# Patient Record
Sex: Male | Born: 1940 | Race: White | Hispanic: No | Marital: Married | Smoking: Never smoker
Health system: Southern US, Community
[De-identification: ages and names within clinical notes are randomized; demographics above are authoritative.]

## PROBLEM LIST (undated history)

## (undated) DIAGNOSIS — I2510 Atherosclerotic heart disease of native coronary artery without angina pectoris: Secondary | ICD-10-CM

## (undated) DIAGNOSIS — E785 Hyperlipidemia, unspecified: Secondary | ICD-10-CM

## (undated) DIAGNOSIS — N189 Chronic kidney disease, unspecified: Secondary | ICD-10-CM

## (undated) DIAGNOSIS — E119 Type 2 diabetes mellitus without complications: Secondary | ICD-10-CM

## (undated) DIAGNOSIS — R42 Dizziness and giddiness: Secondary | ICD-10-CM

## (undated) DIAGNOSIS — N529 Male erectile dysfunction, unspecified: Secondary | ICD-10-CM

## (undated) HISTORY — PX: EYE SURGERY: SHX253

## (undated) HISTORY — PX: COLONOSCOPY: SHX174

## (undated) HISTORY — PX: CORONARY ARTERY BYPASS GRAFT: SHX141

---

## 2004-08-24 ENCOUNTER — Ambulatory Visit: Payer: Self-pay | Admitting: Podiatry

## 2004-11-23 ENCOUNTER — Inpatient Hospital Stay: Payer: Self-pay | Admitting: Cardiology

## 2004-11-23 ENCOUNTER — Other Ambulatory Visit: Payer: Self-pay

## 2006-11-27 ENCOUNTER — Ambulatory Visit: Payer: Self-pay | Admitting: *Deleted

## 2006-12-01 ENCOUNTER — Emergency Department: Payer: Self-pay | Admitting: Emergency Medicine

## 2006-12-01 ENCOUNTER — Other Ambulatory Visit: Payer: Self-pay

## 2006-12-04 ENCOUNTER — Ambulatory Visit: Payer: Self-pay | Admitting: Surgery

## 2006-12-21 ENCOUNTER — Ambulatory Visit: Payer: Self-pay | Admitting: Surgery

## 2006-12-21 ENCOUNTER — Inpatient Hospital Stay (HOSPITAL_COMMUNITY): Admission: RE | Admit: 2006-12-21 | Discharge: 2006-12-25 | Payer: Self-pay | Admitting: Surgery

## 2007-01-15 ENCOUNTER — Ambulatory Visit: Payer: Self-pay | Admitting: Surgery

## 2007-01-15 ENCOUNTER — Encounter: Admission: RE | Admit: 2007-01-15 | Discharge: 2007-01-15 | Payer: Self-pay | Admitting: Surgery

## 2010-03-29 ENCOUNTER — Ambulatory Visit: Payer: Self-pay | Admitting: Gastroenterology

## 2010-03-30 LAB — PATHOLOGY REPORT

## 2010-07-05 NOTE — Op Note (Signed)
NAME:  Colton Gill, Colton Gill NO.:  [id number]   MEDICAL RECORD NO.:  [id number]          PATIENT TYPE:  INP   LOCATION:  2315                         FACILITY:  [facility]   PHYSICIAN:  Evelene Croon, M.D.     DATE OF BIRTH:  12-14-1940   DATE OF PROCEDURE:  12/21/2006  DATE OF DISCHARGE:                               OPERATIVE REPORT   PREOPERATIVE DIAGNOSIS:  Left main and severe three-vessel coronary  artery disease.   POSTOPERATIVE DIAGNOSIS:  Left main and severe three-vessel coronary  artery disease.   OPERATIVE PROCEDURE:  Median sternotomy, extracorporeal circulation,  coronary artery bypass graft surgery x3 using a left internal mammary  artery graft to the left anterior descending coronary artery, with a  saphenous vein graft to the obtuse marginal branch of the left  circumflex coronary artery, and a saphenous vein graft to the posterior  descending branch of the right coronary artery.  Endoscopic vein  harvesting from the right leg.   ATTENDING SURGEON:  Evelene Croon, M.D.   ASSISTANTS:  1. Ferne Reus, SAC.  2. Evon Slack, RNFA.   ANESTHESIA:  General endotracheal.   CLINICAL HISTORY:  This patient is a 70 year old gentleman with a  history of diabetes and coronary artery disease who is status post  stenting of his left circumflex in May 2001.  He underwent repeat  catheterization in 2002 showing 50% narrowing within the stent as well  as two other lesions in the mid and distal right coronary artery of  about 70%.  He was treated medically and underwent repeat  catheterization in October 2006.  At this time, he had stenting of the  proximal left circumflex coronary artery that had 90% stenosis.  He has  done well since then but over the past year has developed exertional  shortness of breath and fatigue.  He underwent a stress test that showed  basal inferior and apical inferior ischemia.  Cardiac catheterization on  November 28, 2006 by Dr. Lenise Herald showed a 30% distal left main  stenosis that appeared to have some ulcerated plaque present.  There is  about 70% ostial LAD stenosis that was hazy.  This lesion was also  calcified.  There is about 40% proximal LAD stenosis.  The left  circumflex had about 30% stenosis at the proximal margin of the  previously placed stent.  There is about 40% distal left circumflex  stenosis and a single large marginal branch.  The proximal right  coronary artery had about 30% stenosis and the distal right coronary  artery had about 70% stenosis.  There is a small to moderate sized  posterior descending branch and then a few smaller posterolateral  branches.  Left ventricular ejection fraction is about 45% with no  aortic stenosis or insufficiency.  There was no mitral regurgitation.  After review of the angiogram and examination of the patient, it was  felt that coronary artery bypass graft surgery was the best treatment to  prevent further ischemia and infarction.  I discussed the operative  procedure with the patient and his wife including the alternatives,  benefits and risks including but not limited to bleeding, blood  transfusion, infection, stroke, myocardial infarction, graft failure and  death.  They understood and agreed to proceed.   OPERATIVE PROCEDURE:  The patient was taken to the operating room and  placed on the table in the supine position.  After induction of general  endotracheal anesthesia, a Foley catheter was placed in the bladder  using sterile technique.  Then the chest, abdomen and both lower  extremities were prepped and draped in the usual sterile manner.  The  chest was entered through a median sternotomy incision and the  pericardium opened midline.  Examination of the heart showed good  ventricular contractility.  The ascending aorta had no palpable plaques  in it.   Then the left internal mammary artery was harvested from the chest wall  as a pedicle graft.   This was a medium caliber vessel with excellent  blood flow through it.  At the same time, segment of greater saphenous  vein was harvested from the right leg using endoscopic vein harvest  technique.  This vein was of medium size and good quality.   Then the patient was heparinized and when an adequate activated  clotting time was achieved, the distal ascending aorta was cannulated  using a 20-French aortic cannula for arterial inflow.  Venous outflow  was achieved using the two-stage venous cannula for the right atrial  appendage and antegrade cardioplegia and vent cannula was inserted into  the aortic root.   The patient was placed on cardiopulmonary bypass and the distal coronary  artery was identified.  The LAD was a large vessel in its proximal and  mid portions, tapering near the apex. There was heavy proximal disease  in it but the mid portion of the vessel was soft and suitable for  grafting.  There were several small diagonal branches.  The left  circumflex had a single large marginal branch that had no significant  disease in it.  The right coronary artery was diffusely diseased with a  calcific plaque throughout the proximal, mid and distal portions.  The  posterior descending branch was a small to medium size vessel.  It was  suitable for grafting and had no disease in it.  The posterolateral  branches were small non-grafting vessels.  The calcified plaque in the  main body of the right coronary artery extended out distally far enough  that I could not grasp beyond it.   Then, the aorta was cross clamped and 1,000 mL of cold blood antegrade  cardioplegia was administered in the aortic root with quick arrest of  the heart.  Systemic hypothermia to 28.0 degree centigrade and topical  hypothermia with iced saline was used.  A temperature probe was placed  in the septum, insulated and padded in the pericardium.   The first distal anastomosis was performed to the obtuse  marginal  branch.  The internal diameter was about 2 mm.  The conduit used was a 7  to greater saphenous vein.  The anastomosis performed in an end-to-side  manner using continuous 7-0 Prolene suture.  Flow administered to the  graft was excellent.   Second distal anastomosis was performed to the posterior descending  coronary artery.  The internal diameter was 1.6 mm.  The conduit used  was a second 7 to greater saphenous vein and the anastomosis performed  in the end-to-side manner using continuous 7-0 Prolene suture.  Flow  administered to the graft was excellent.  The third distal anastomosis was performed to the mid portion of the  left anterior descending coronary artery.  The internal diameter of this  vessel was about 2 mm.  The conduit used was a left internal mammary  graft and was brought through an opening in the left pericardium  anterior to the phrenic nerve.  It was anastomosed to the LAD in an end-  to-side manner using continuous 8-0 Prolene suture.  The pedicle was  sutured to the epicardium with 6-0 Prolene sutures.  The patient was  rewarmed to 37.0 degrees centigrade.  With the cross-clamp in place, the  two proximal vein graft anastomoses were performed at the aortic root in  an end-to-side manner using continuous 6-0 Prolene suture.  Then the  clamp was removed from the mammary pedicle.  There was rapid warming of  the ventricular septum and returned spontaneous ventricular  fibrillation.  Cross-clamp was removed at the time of 53 minutes and the  patient spontaneously converted to sinus rhythm.  The proximal and  distal anastomosis appeared hemostatic along the grafts fascia.  Graft  markers were placed around the proximal anastomoses.  Two temporary  right ventricular and right atrial pacing wires were placed and brought  through the skin.   When the patient had rewarmed to 37.0 degrees centigrade, he was weaned  from cardiopulmonary bypass on no inotropic  agents.  Total bypass time  was 68 minutes.  Cardiac function appeared excellent with a cardiac  output of 10 liters per minute.  Protamine was given and the venous and  aortic cannulas were removed without difficulty.  Hemostasis was  achieved.  Three chest tubes were placed with a tube in the posterior  pericardium, one in the left pleural space and one in the anterior  mediastinum.  The pericardium was loosely reapproximated over the heart.  The sternum was closed with #6 stainless steel wires.  The fascia was  closed with continuous #1 Vicryl suture.  Subcutaneous tissue was closed  with continuous 2-0 Vicryl and the skin with 3-0 Vicryl subcuticular  closure.  The lower extremity vein harvest site was closed in layers in  a similar manner.  The sponge, needle, and instrument counts were  correct according to the scrub nurse.  Dry sterile dressings were  applied over the incisions, around the chest tubes left to Pleur-evac  suction. The patient remained hemodynamically stable and was transported  to the ICU in guarded but stable condition.      Evelene Croon, M.D.  Electronically Signed     BB/MEDQ  D:  12/21/2006  T:  12/21/2006  Job:  [id number]   cc:   Evelene Croon, M.D.  Darlin Priestly, MD  Cardiac Catheterization Lab [facility]

## 2010-07-05 NOTE — Assessment & Plan Note (Signed)
OFFICE VISIT   Colton Gill, Colton Gill  DOB:  02-28-1940                                        January 15, 2007  CHART #:  [id number]   The patient returns today for followup status post coronary artery  bypass graft surgery x3 on 12/21/2006.  He said he has been feeling well  overall and is walking short distances [place] without any  difficulty.  He is planning on starting cardiac rehab in the near  future.   PHYSICAL EXAMINATION:  VITAL SIGNS:  Blood pressure is 139/74.  His  pulse is 72 and regular.  Respiratory rate is 18, unlabored.  Oxygen  saturation on room air is 98%.  He looks well.  CARDIAC:  Exam shows a  regular rate and rhythm with normal heart sounds.  LUNGS:  Exam is  clear.  CHEST:  Incision is healing well and the sternum is stable.  EXTREMITIES:  His leg incision is healing well and there is no lower  extremity edema.   DATA:  Followup chest x-ray shows improved bibasilar aeration.  There is  no pneumothorax or effusion.   MEDICATIONS:  1. Aspirin 325 mg daily.  2. Metformin 1000 mg b.i.d.  3. Zocor 40 mg daily.  4. Toprol XL 50 mg daily.  5. Amaryl 4 mg daily.  6. Glyburide 5 mg b.i.d.  7. Ramipril 1.25 mg daily.  8. Plavix 75 mg daily.  9. Fish oil 1200 mg b.i.d.  10.Vitamin daily.  11.Oxycodone p.r.n. for pain, which he has stopped taking.   IMPRESSION:  Overall the patient is recovering well following his  surgery.  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  I encouraged him to continue walking as much as  possible and to participate in cardiac rehab.  He will continue to  follow up with Dr. Lenise Herald and will contact me if he develops any  problems with his incision.   Evelene Croon, M.D.  Electronically Signed   BB/MEDQ  D:  01/15/2007  T:  01/15/2007  Job:  [id number]   cc:   Darlin Priestly,  MD  Dr. Sherlie Ban, M.D.

## 2010-07-05 NOTE — Discharge Summary (Signed)
NAME:  Colton Gill, Colton Gill NO.:  [id number]   MEDICAL RECORD NO.:  [id number]          PATIENT TYPE:  INP   LOCATION:  2016                         FACILITY:  [facility]   PHYSICIAN:  Rowe Clack, P.A.-C. DATE OF BIRTH:  Jun 14, 1940   DATE OF ADMISSION:  12/21/2006  DATE OF DISCHARGE:  12/25/2006                               DISCHARGE SUMMARY   HISTORY OF PRESENT ILLNESS:  The patient is a 70 year old gentleman  referred to Dr. Evelene Croon in surgical consultation for severe 3-  vessel coronary artery disease.  The patient has a history of coronary  artery disease and has had a previous stenting of the left circumflex in  may of 2001.  A repeat catheterization in 2002 showed a 50% narrowing of  the stent as well as 2 lesions in the mid and distal right coronary  arteries of 70%.  He was treated medical and underwent repeat  catheterization on November 23, 2004, and this showed a 30% osteal left  main stenosis with a 50% osteal stenosis in the LAD.  The mid LAD had  mild luminal irregularities.  The proximal circumflex had a 90% stenosis  which was stented successfully with a Cypher stent.  The mid left  circumflex had diffuse 50% stenosis.  The proximal right coronary artery  had no significant change.  Since 2006, the patient did well but did  report exertional shortness of breath over the past year or so.  He  would usually notice this with ambulation up inclines.  He reported  having some mild localized left chest pain which resolved without any  treatment.  This would usually occur with exertion.  A stress test  revealed ischemia in the basal inferior to apical inferior walls.  He  therefore underwent repeat cardiac catheterization by Dr. Jenne Campus on  November 28, 2006.  This showed the distal left main had about a 30%  stenosis.  The osteal LAD had a 70% stenosis.  The lesion was calcified.  There was a 40% proximal LAD stenosis.  The left circumflex had a 30%  stenosis  at the proximal margin of the previously stented segment.  There was about a 40% distal left circumflex stenosis.  The proximal  right coronary artery had a 30% stenosis and the distal right coronary  artery had a 70% stenosis.  Upon evaluation by Dr. Laneta Simmers, it had  appeared that a left main stenosis was very irregular and somewhat  ulcerated in appearance.  The ventricular ejection fraction was about  45% with no specific regional wall motion abnormalities.  There was no  aortic stenosis or insufficiency.  There was no mitral regurgitation.  It was Dr. Scheryl Darter opinion that the patient should proceed with  surgical revascularization and he was admitted this hospitalization for  the procedure.   ALLERGIES:  NONE.   MEDICATIONS PRIOR TO ADMISSION:  1. Aspirin 81 mg daily.  2. Fish oil 1200 mg b.i.d.  3. Metformin 1000 mg b.i.d.  4. Zocor 20 mg daily.  5. Imdur 30 mg daily.  6. Toprol XL 50 mg daily.  7.  Amaryl 4 mg b.i.d.  8. Ramipril 10 mg daily.  9. Glyburide 5 mg q.i.d.  10.Plavix 75 mg, which was recently stopped in anticipation of      surgery.   PAST MEDICAL HISTORY:  1. Diabetes.  2. Hypertension.  3. Hyperlipidemia.  4. Coronary artery disease as described.  5. History of erectile dysfunction.   Family history, social history, review of systems, and physical exam,  please see the history and physical done at time of admission.   HOSPITAL COURSE:  The patient was admitted electively and on December 21, 2006 taken to the operating room where he underwent a coronary artery  bypass grafting x3 using a left internal mammary artery to the left  anterior descending coronary artery.  Additionally, he had a saphenous  vein graft to the obtuse marginal branch of the left circumflex coronary  artery.  Finally, a saphenous vein graft was placed to the posterior  descending branch of the right coronary artery.  The patient tolerated  the procedure well and was taken to the  surgical intensive care unit in  stable condition.   POSTOPERATIVE HOSPITAL COURSE:  The patient has overall done quite well.  He is maintaining stable hemodynamics.  He has been neurologically  intact.  He was weaned from the ventilator without difficulty using  standard protocols.  All routine lines, monitors, and drainage devices  have been discontinued in the standard fashion.  His diabetes is well-  controlled initially with the glucomander protocol.  He has subsequently  been converted back to his oral medical regimen for his diabetes.  His  laboratory values do reveal a mild postoperative anemia.  His most  recent hemoglobin and hematocrit dated December 24, 2006 are 11 and 33  respectively.  Electrolytes, BUN, and creatinine are within normal  limits.  His oxygen is being weaned and he maintains adequate  saturations.  He is responding well to a general diuresis.  He does have  some mild edema and will require a little more diuresis as an  outpatient.  His incisions are healing well without signs of infection.  He is tolerating cardiac rehabilitation using standard protocols.  His  overall progress is felt to be quite good, and tentatively he is felt to  be stable for discharge in the morning of December 25, 2006 pending  morning round reevaluation.   MEDICATIONS AT TIME OF DISCHARGE:  1. Aspirin 325 mg daily.  2. Centrum Silver vitamin 1 daily.  3. Fish oil 1200 mg b.i.d.  4. Metformin 1000 mg b.i.d.  5. Zocor 40 mg daily.  6. Toprol XL 50 mg daily.  7. Amaryl 4 mg daily.  8. Ramipril 1.25 mg daily.  9. Glyburide 5 mg twice daily.  10.Plavix 75 mg daily.  11.Lasix 40 mg daily for 5 days.  12.K-Dur 20 mEq daily for 5 days.  13.For pain, oxycodone 5 mg one to two every 4 to 6 hours as needed.   INSTRUCTIONS:  The patient will receive written instructions regarding  medications, activity, diet, wound care, and follow up.   Follow up will include Dr. Jenne Campus in 2 weeks, Dr.  Laneta Simmers in 3 weeks.   FINAL DIAGNOSES:  1. Severe coronary artery disease as described above, now status post      surgical revascularization as described above.  2. Mild postoperative anemia.  3. Diabetes mellitus.  4. Hypertension.  5. Hyperlipidemia.  6. History of erectile dysfunction.      Rowe Clack,  P.A.-C.     Sherryll Burger  D:  12/24/2006  T:  12/24/2006  Job:  [id number]   cc:   Evelene Croon, M.D.  Darlin Priestly, MD  Worthy Keeler, Dr.

## 2010-07-05 NOTE — Consult Note (Signed)
NEW PATIENT CONSULTATION   JUSITN, SALSGIVER  DOB:  07/24/1940                                        December 04, 2006  CHART #:  [id number]   REASON FOR CONSULTATION:  Severe 3-vessel coronary artery disease.   CLINICAL HISTORY:  I was asked by Dr. Lenise Herald to evaluate Mr.  Colton Gill for consideration of coronary artery bypass graft surgery.  He is  a 70 year old gentleman with a history of coronary disease.  He is a 89-  year-old gentleman with a history of coronary disease status post  stenting of the left circumflex in May of 2001.  He had repeat  catheterization in 2002 showing 50% narrowing of the stent, as well as 2  lesions in the mid and distal right coronary artery of 70%.  He was  treated medically and underwent repeat catheterization on 11/23/2004  showing 30% ostial left main disease and 50% ostial stenosis in the LAD.  The mid LAD had mild luminal irregularities.  The proximal circumflex  had 90% stenosis which was stented successfully with a Cypher stent.  The mid left circumflex had diffuse 50% stenosis.  The proximal right  coronary artery had no significant change.  Since 2006, he has done well  but he does report exertional shortness of breath over the past year or  so.  He usually notices when he walks up inclines.  He reports having  some mild localized left chest pain which resolved without any  treatment.  This usually also occurs with exertion.  He underwent a  stress test which showed ischemia in the basal inferior to apical  inferior walls.  He subsequently underwent cardiac catheterization by  Dr. Jenne Campus on 11/28/2006.  This showed the distal left main had about  30% stenosis.  The ostial LAD had 70% stenosis.  This lesion was  calcified.  There is about 40% proximal LAD stenosis.  The left  circumflex had 30% stenosis at the proximal margin of the previously  stented segment.  There is about 40% distal left circumflex stenosis.  The  proximal right coronary artery had 30% stenosis and the distal right  coronary artery had 70% stenosis.  On my review of the catheterization,  it appeared that this left main stenosis was very irregular and somewhat  ulcerated in appearance.  Left ventricular ejection fraction was about  45% with no specific regional wall motion abnormalities.  There was no  evidence of aortic stenosis or insufficiency.  There was no mitral  regurgitation.   REVIEW OF SYSTEMS:  GENERAL:  He denies any fever or chills.  He has had  no recent weight changes.  He denies fatigue although his wife said he  will fall asleep whenever he stays still very long.  EYES:  Negative.  ENT:  Negative.  ENDOCRINE:  He denies hypothyroidism.  He has had type 2 diabetes since  age 20.  CARDIOVASCULAR:  He has focal left-sided chest pain with exertion that  resolves quickly with rest.  He denies any pressure.  He has exertional  dyspnea on walking up inclines but not on flat land.  He denies PND and  orthopnea.  He has had no peripheral edema.  He denies arrhythmias.  RESPIRATORY:  He denies cough and sputum production.  GI:  He has had no nausea or vomiting.  Denies melena and bright red  blood per rectum.  GU:  He denies dysuria and hematuria.  VASCULAR:  He denies claudication and phlebitis.  NEUROLOGICAL:  He denies any focal weakness or numbness.  Denies  dizziness and syncope.  He has never had a TIA or a stroke.  MUSCULOSKELETAL:  He denies arthralgias and myalgias.  HEMATOLOGICAL:  He denies any bleeding disorders or easy bleeding.   ALLERGIES:  None.   MEDICATIONS:  Aspirin 81 mg daily, fish oil 1200 mg b.i.d., metformin  1000 mg b.i.d., Zocor 20 mg daily, Imdur 30 mg daily, Toprol XL 50 mg  daily, Amaril 4 mg b.i.d., ramipril 10 mg daily, glyburide 5 mg q.i.d.,  and Plavix 75 mg daily which stopped ago with anticipation of surgery.   PAST MEDICAL HISTORY:  Significant for diabetes as mentioned above.  He   has a history of hypertension and hyperlipidemia.  He has a history of  coronary disease status post percutaneous intervention as mentioned  above.  He has a history of erectile dysfunction.   SOCIAL HISTORY:  He is married and has 1 son.  He is here with his wife  today.  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  He does not smoke.  He drinks occasional alcohol.   FAMILY HISTORY:  His father had diabetes and kidney disease.  His mother  was healthy into her 90s.   PHYSICAL EXAMINATION:  His blood pressure is 147/63.  Pulse 63 and  regular.  Respiratory rate is 18, nonlabored.  Oxygen saturation on room  air is 97%.  He is a well-developed white male in no distress.  HEENT  Exam:  Shows him to be normocephalic and atraumatic.  Pupils are equal  and reactive to light and accommodation.  His extraocular muscles are  intact.  His throat is clear.  Neck Exam:  Shows normal carotid pulses  bilaterally.  There are no bruits.  There is no adenopathy or  thyromegaly.  Cardiac Exam:  Shows a regular rate and rhythm with normal  S1 and S2.  There is no murmur, rub, or gallop.  His lungs are clear.  Abdominal Exam:  Shows active bowel sounds.  His abdomen is soft and  nontender.  There are no palpable masses or organomegaly.  Extremity  Exam:  Shows no peripheral edema.  Pedal pulses are palpable  bilaterally.  His skin is warm and dry.  Neurologic Exam:  Shows him to  be alert and oriented x3.  Motor and sensory exam is grossly normal.   IMPRESSION:  Colton Gill has an ulcerated moderate distal left main  stenosis and 3-vessel coronary disease with 70% ostial LAD stenosis.  He  has ischemia by stress test and symptoms of ischemia that are not  classical angina.  I agree that coronary artery bypass graft surgery is  the best treatment to prevent further ischemia and infarction.  I  discussed the operative procedure with the patient and his wife  including  alternatives, benefits, and risks including but not limited  bleeding, blood transfusion, infection, stroke, myocardial infarction,  graft failure, and death.  He understands and agrees to proceed.  We  will plan to do surgery in the next 2 weeks.   Evelene Croon, M.D.  Electronically Signed   BB/MEDQ  D:  12/04/2006  T:  12/05/2006  Job:  [id number]   cc:   Darlin Priestly, MD  Doristine Church  Graciela Husbands, MD

## 2010-11-29 LAB — CBC
HCT: 31.4 — ABNORMAL LOW
HCT: 32.3 — ABNORMAL LOW
HCT: 33.1 — ABNORMAL LOW
Hemoglobin: 10.6 — ABNORMAL LOW
Hemoglobin: 10.9 — ABNORMAL LOW
Hemoglobin: 11 — ABNORMAL LOW
MCHC: 33.3
MCHC: 33.8
MCHC: 33.8
MCV: 84.6
MCV: 85.3
MCV: 85.4
MCV: 86.7
Platelets: 146 — ABNORMAL LOW
Platelets: 181
RBC: 3.69 — ABNORMAL LOW
RBC: 3.74 — ABNORMAL LOW
RBC: 3.78 — ABNORMAL LOW
RBC: 3.82 — ABNORMAL LOW
RDW: 13.5
RDW: 13.7
RDW: 14
WBC: 10
WBC: 7.2
WBC: 8.9

## 2010-11-29 LAB — BASIC METABOLIC PANEL
BUN: 12
BUN: 13
BUN: 15
BUN: 8
CO2: 28
CO2: 28
CO2: 28
Calcium: 7.4 — ABNORMAL LOW
Calcium: 7.7 — ABNORMAL LOW
Calcium: 7.8 — ABNORMAL LOW
Calcium: 8.2 — ABNORMAL LOW
Chloride: 104
Chloride: 105
Chloride: 107
Creatinine, Ser: 0.96
Creatinine, Ser: 1.02
Creatinine, Ser: 1.09
GFR calc Af Amer: >60
GFR calc Af Amer: >60
GFR calc Af Amer: >60
GFR calc Af Amer: >60
GFR calc non Af Amer: >60
GFR calc non Af Amer: >60
GFR calc non Af Amer: >60
Glucose, Bld: 108 — ABNORMAL HIGH
Glucose, Bld: 172 — ABNORMAL HIGH
Glucose, Bld: 193 — ABNORMAL HIGH
Glucose, Bld: 211 — ABNORMAL HIGH
Potassium: 3.8
Potassium: 4.3
Potassium: 4.3
Sodium: 136
Sodium: 136
Sodium: 138
Sodium: 139

## 2010-11-29 LAB — MAGNESIUM: Magnesium: 2.4

## 2010-11-29 LAB — CREATININE, SERUM: GFR calc non Af Amer: >60

## 2010-11-30 LAB — I-STAT EC8
BUN: 7
Bicarbonate: 22.2
Chloride: 106
Glucose, Bld: 126 — ABNORMAL HIGH
HCT: 32 — ABNORMAL LOW
Hemoglobin: 10.9 — ABNORMAL LOW
Sodium: 142

## 2010-11-30 LAB — POCT I-STAT 3, ART BLOOD GAS (G3+)
Acid-base deficit: 4 — ABNORMAL HIGH
Acid-base deficit: 4 — ABNORMAL HIGH
Bicarbonate: 23.2
O2 Saturation: 100
O2 Saturation: 93
O2 Saturation: 95
Patient temperature: 37
TCO2: 22
TCO2: 23
TCO2: 24
pCO2 arterial: 36.6
pCO2 arterial: 42.3
pO2, Arterial: 268 — ABNORMAL HIGH

## 2010-11-30 LAB — URINALYSIS, ROUTINE W REFLEX MICROSCOPIC
Bilirubin Urine: NEGATIVE
Hgb urine dipstick: NEGATIVE

## 2010-11-30 LAB — BLOOD GAS, ARTERIAL
Acid-base deficit: 2.5 — ABNORMAL HIGH
O2 Saturation: 97.1
Patient temperature: 98.6
pCO2 arterial: 34.4 — ABNORMAL LOW
pO2, Arterial: 83.7

## 2010-11-30 LAB — URINE MICROSCOPIC-ADD ON

## 2010-11-30 LAB — PROTIME-INR
INR: 1.3
Prothrombin Time: 12.8

## 2010-11-30 LAB — POCT I-STAT 4, (NA,K, GLUC, HGB,HCT)
Glucose, Bld: 182 — ABNORMAL HIGH
Glucose, Bld: 198 — ABNORMAL HIGH
Glucose, Bld: 238 — ABNORMAL HIGH
Glucose, Bld: 270 — ABNORMAL HIGH
HCT: 21 — ABNORMAL LOW
HCT: 32 — ABNORMAL LOW
HCT: 34 — ABNORMAL LOW
Hemoglobin: 10.9 — ABNORMAL LOW
Hemoglobin: 11.9 — ABNORMAL LOW
Hemoglobin: 7.1 — CL
Operator id: 3406
Operator id: 3406
Potassium: 3.7
Potassium: 4.4
Potassium: 4.4
Sodium: 132 — ABNORMAL LOW
Sodium: 136
Sodium: 139

## 2010-11-30 LAB — TYPE AND SCREEN

## 2010-11-30 LAB — CBC
HCT: 41.6
Hemoglobin: 11.7 — ABNORMAL LOW
MCHC: 34
MCHC: 34.1
MCV: 84.3
Platelets: 155
RBC: 4.01 — ABNORMAL LOW
RBC: 4.93
RDW: 13.4
WBC: 12.6 — ABNORMAL HIGH
WBC: 5.8

## 2010-11-30 LAB — CREATININE, SERUM
Creatinine, Ser: 0.79
GFR calc Af Amer: >60
GFR calc non Af Amer: >60

## 2010-11-30 LAB — COMPREHENSIVE METABOLIC PANEL
ALT: 30
AST: 26
Albumin: 4
Chloride: 103
GFR calc non Af Amer: >60
Glucose, Bld: 262 — ABNORMAL HIGH

## 2010-11-30 LAB — MAGNESIUM: Magnesium: 2.6 — ABNORMAL HIGH

## 2010-11-30 LAB — APTT
aPTT: 32
aPTT: 33

## 2010-11-30 LAB — HEMOGLOBIN A1C: Hgb A1c MFr Bld: 8.9 — ABNORMAL HIGH

## 2011-10-02 ENCOUNTER — Inpatient Hospital Stay: Payer: Self-pay | Admitting: Internal Medicine

## 2011-10-02 DIAGNOSIS — I200 Unstable angina: Secondary | ICD-10-CM

## 2011-10-02 LAB — URINALYSIS, COMPLETE
Bacteria: NONE SEEN
Glucose,UR: >=500 mg/dL (ref 0–75)
Leukocyte Esterase: NEGATIVE
Nitrite: NEGATIVE
RBC,UR: NONE SEEN /HPF (ref 0–5)
Specific Gravity: 1.008 (ref 1.003–1.030)
WBC UR: <1 /HPF (ref 0–5)

## 2011-10-02 LAB — CBC WITH DIFFERENTIAL/PLATELET
Basophil %: 1.2 %
Eosinophil #: 0.1 10*3/uL (ref 0.0–0.7)
Eosinophil %: 2 %
HGB: 13.6 g/dL (ref 13.0–18.0)
MCV: 86 fL (ref 80–100)
Monocyte %: 10.4 %
Neutrophil %: 63.5 %
Platelet: 180 10*3/uL (ref 150–440)
RBC: 4.68 10*6/uL (ref 4.40–5.90)
WBC: 5.3 10*3/uL (ref 3.8–10.6)

## 2011-10-02 LAB — COMPREHENSIVE METABOLIC PANEL
BUN: 18 mg/dL (ref 7–18)
Bilirubin,Total: 0.5 mg/dL (ref 0.2–1.0)
Chloride: 105 mmol/L (ref 98–107)
Creatinine: 1.38 mg/dL — ABNORMAL HIGH (ref 0.60–1.30)
EGFR (African American): >60
Glucose: 193 mg/dL — ABNORMAL HIGH (ref 65–99)
SGPT (ALT): 35 U/L (ref 12–78)
Total Protein: 8 g/dL (ref 6.4–8.2)

## 2011-10-02 LAB — TROPONIN I: Troponin-I: <0.02 ng/mL

## 2011-10-02 LAB — PROTIME-INR: Prothrombin Time: 12.6 secs (ref 11.5–14.7)

## 2011-10-03 LAB — BASIC METABOLIC PANEL
Calcium, Total: 8.7 mg/dL (ref 8.5–10.1)
Creatinine: 1.32 mg/dL — ABNORMAL HIGH (ref 0.60–1.30)
EGFR (Non-African Amer.): 55 — ABNORMAL LOW
Potassium: 4 mmol/L (ref 3.5–5.1)
Sodium: 143 mmol/L (ref 136–145)

## 2011-10-03 LAB — CBC WITH DIFFERENTIAL/PLATELET
Basophil #: 0.1 10*3/uL (ref 0.0–0.1)
Basophil %: 1.5 %
Eosinophil #: 0.1 10*3/uL (ref 0.0–0.7)
HGB: 12.8 g/dL — ABNORMAL LOW (ref 13.0–18.0)
Lymphocyte %: 22.5 %
MCHC: 34.1 g/dL (ref 32.0–36.0)
Neutrophil %: 62.9 %
Platelet: 174 10*3/uL (ref 150–440)

## 2012-03-22 ENCOUNTER — Ambulatory Visit: Payer: Self-pay | Admitting: Ophthalmology

## 2012-08-20 LAB — TROPONIN I: Troponin-I: <0.02 ng/mL

## 2012-08-21 ENCOUNTER — Inpatient Hospital Stay: Payer: Self-pay | Admitting: Internal Medicine

## 2012-08-21 LAB — COMPREHENSIVE METABOLIC PANEL
Alkaline Phosphatase: 43 U/L — ABNORMAL LOW (ref 50–136)
Anion Gap: 4 — ABNORMAL LOW (ref 7–16)
BUN: 22 mg/dL — ABNORMAL HIGH (ref 7–18)
Bilirubin,Total: 0.5 mg/dL (ref 0.2–1.0)
Calcium, Total: 8.5 mg/dL (ref 8.5–10.1)
Chloride: 108 mmol/L — ABNORMAL HIGH (ref 98–107)
Co2: 29 mmol/L (ref 21–32)
Glucose: 101 mg/dL — ABNORMAL HIGH (ref 65–99)
Potassium: 4.4 mmol/L (ref 3.5–5.1)
Total Protein: 6.8 g/dL (ref 6.4–8.2)

## 2012-08-21 LAB — URINALYSIS, COMPLETE
Bacteria: NONE SEEN
Bilirubin,UR: NEGATIVE
Blood: NEGATIVE
Leukocyte Esterase: NEGATIVE
Nitrite: NEGATIVE
Ph: 5 (ref 4.5–8.0)
Specific Gravity: 1.005 (ref 1.003–1.030)
Squamous Epithelial: NONE SEEN

## 2012-08-21 LAB — CBC WITH DIFFERENTIAL/PLATELET
Basophil #: 0 10*3/uL (ref 0.0–0.1)
HCT: 37.1 % — ABNORMAL LOW (ref 40.0–52.0)
Lymphocyte #: 1 10*3/uL (ref 1.0–3.6)
MCHC: 34 g/dL (ref 32.0–36.0)
Neutrophil #: 5.5 10*3/uL (ref 1.4–6.5)
Platelet: 152 10*3/uL (ref 150–440)
RDW: 14.6 % — ABNORMAL HIGH (ref 11.5–14.5)

## 2012-08-22 LAB — BASIC METABOLIC PANEL
BUN: 18 mg/dL (ref 7–18)
Chloride: 108 mmol/L — ABNORMAL HIGH (ref 98–107)
Co2: 28 mmol/L (ref 21–32)
Creatinine: 1.74 mg/dL — ABNORMAL HIGH (ref 0.60–1.30)
EGFR (African American): 45 — ABNORMAL LOW
EGFR (Non-African Amer.): 39 — ABNORMAL LOW
Glucose: 154 mg/dL — ABNORMAL HIGH (ref 65–99)
Osmolality: 288 (ref 275–301)

## 2012-08-26 ENCOUNTER — Ambulatory Visit: Payer: Self-pay | Admitting: Urology

## 2012-09-05 ENCOUNTER — Ambulatory Visit: Payer: Self-pay | Admitting: Urology

## 2012-09-20 ENCOUNTER — Ambulatory Visit: Payer: Self-pay | Admitting: Urology

## 2013-05-01 ENCOUNTER — Ambulatory Visit: Payer: Self-pay | Admitting: Urology

## 2014-03-13 ENCOUNTER — Emergency Department: Payer: Self-pay | Admitting: Emergency Medicine

## 2014-03-13 LAB — BASIC METABOLIC PANEL
Anion Gap: 5 — ABNORMAL LOW (ref 7–16)
BUN: 25 mg/dL — ABNORMAL HIGH (ref 7–18)
CREATININE: 1.99 mg/dL — AB (ref 0.60–1.30)
Calcium, Total: 9.3 mg/dL (ref 8.5–10.1)
Chloride: 108 mmol/L — ABNORMAL HIGH (ref 98–107)
Co2: 29 mmol/L (ref 21–32)
EGFR (Non-African Amer.): 35 — ABNORMAL LOW
GFR CALC AF AMER: 43 — AB
GLUCOSE: 123 mg/dL — AB (ref 65–99)
Osmolality: 289 (ref 275–301)
Potassium: 4.5 mmol/L (ref 3.5–5.1)
SODIUM: 142 mmol/L (ref 136–145)

## 2014-03-13 LAB — CBC
HCT: 40.3 % (ref 40.0–52.0)
HGB: 13.1 g/dL (ref 13.0–18.0)
MCH: 28.8 pg (ref 26.0–34.0)
MCHC: 32.4 g/dL (ref 32.0–36.0)
MCV: 89 fL (ref 80–100)
Platelet: 171 10*3/uL (ref 150–440)
RBC: 4.53 10*6/uL (ref 4.40–5.90)
RDW: 14.3 % (ref 11.5–14.5)
WBC: 4.5 10*3/uL (ref 3.8–10.6)

## 2014-03-13 LAB — TROPONIN I: Troponin-I: <0.02 ng/mL

## 2014-03-28 IMAGING — CR DG ABDOMEN 1V
1 series · 1 of 1 positions shown · non-contrast
Comparison: none

REASON FOR EXAM: Calculus
COMMENTS:

[t abdomen supine]
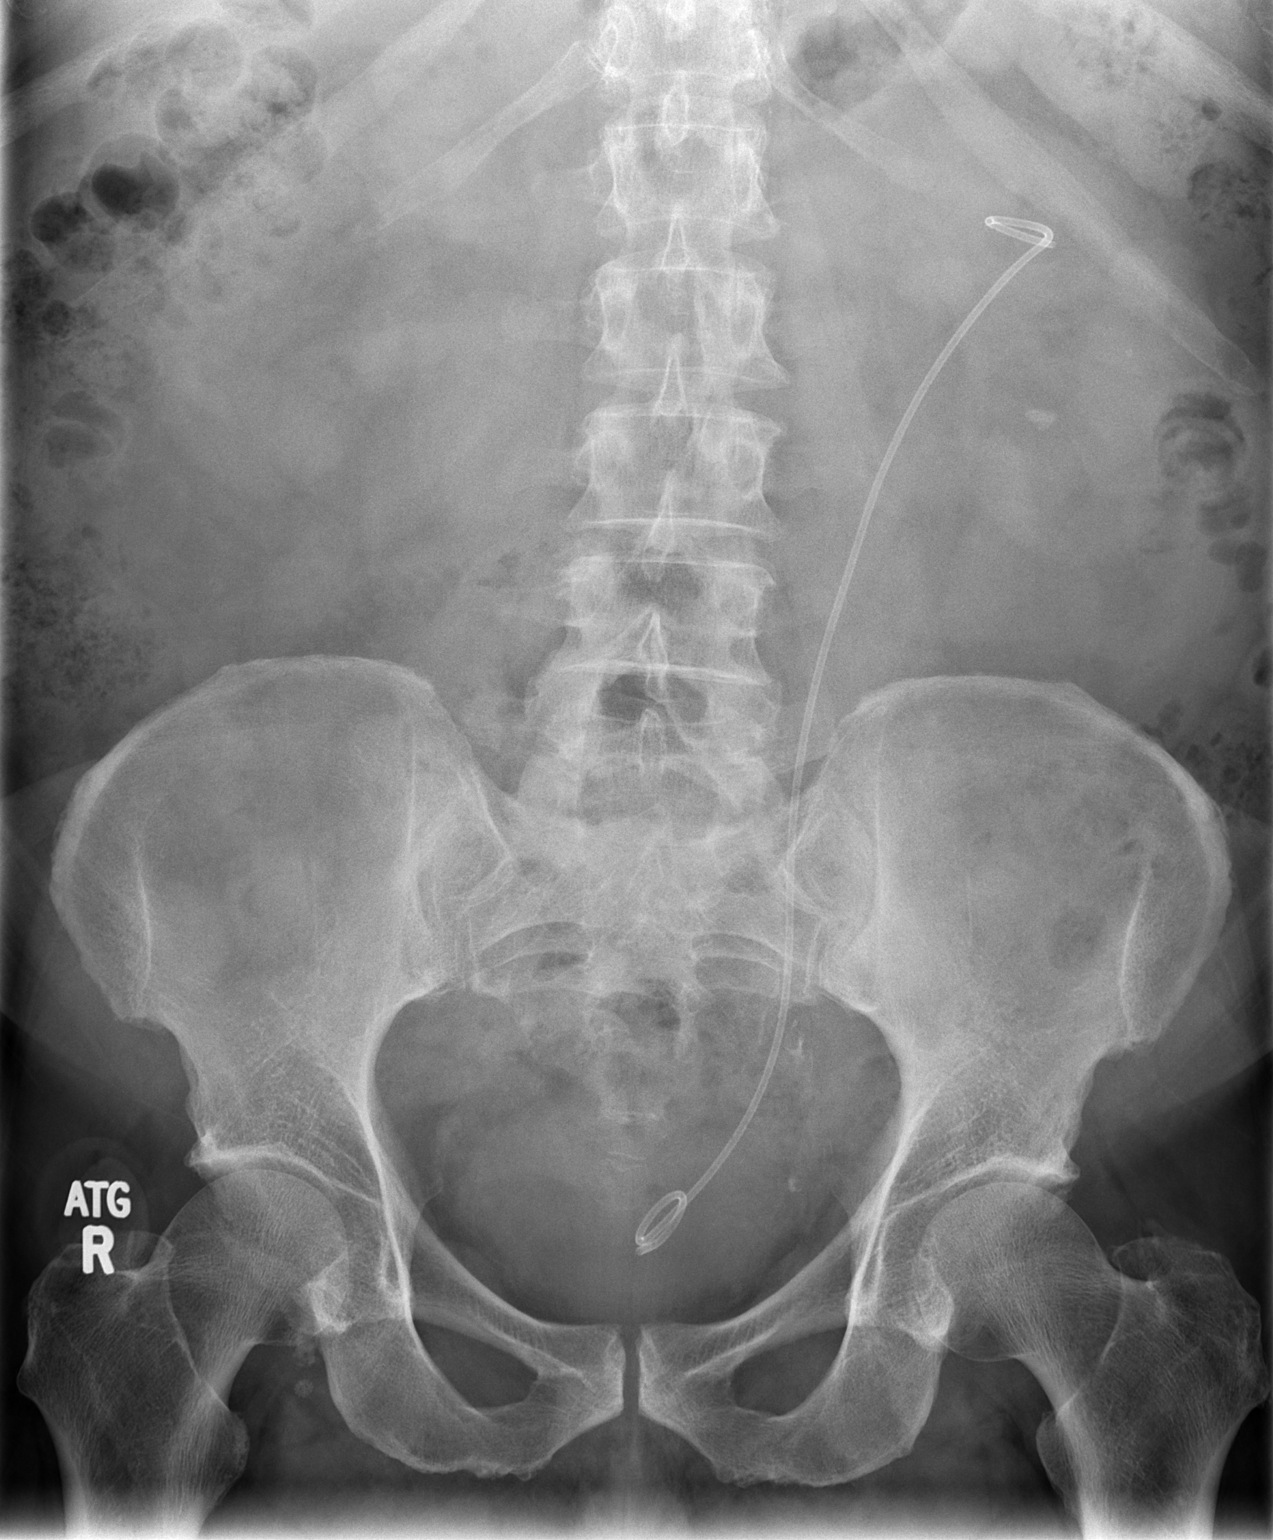

[1 of 1 positions shown; findings below may reference images not displayed]

PROCEDURE:     DXR - DXR KIDNEY URETER BLADDER  - September 05, 2012  [DATE]

RESULT:     Comparison is made to study 26 August, 2012.

A double-J ureteral stent remains in place on the left. A 4 x 8 mm calcified
stone projects over the lower pole of the left kidney. There may be a
smaller stone over the midpole. On the right no definite stones are
demonstrated.
IMPRESSION: There is a double-J ureteral stone correction ureteral
stent on the left. A 4 x 8 mm stone previously demonstrated in the proximal
left ureter now lies in a lower pole calyx on the left.

[REDACTED]

## 2014-04-12 IMAGING — CR DG ABDOMEN 1V
1 series · 1 of 1 positions shown · non-contrast
Comparison: none

REASON FOR EXAM: nephrolithasis, renal colic
COMMENTS:

[ap]
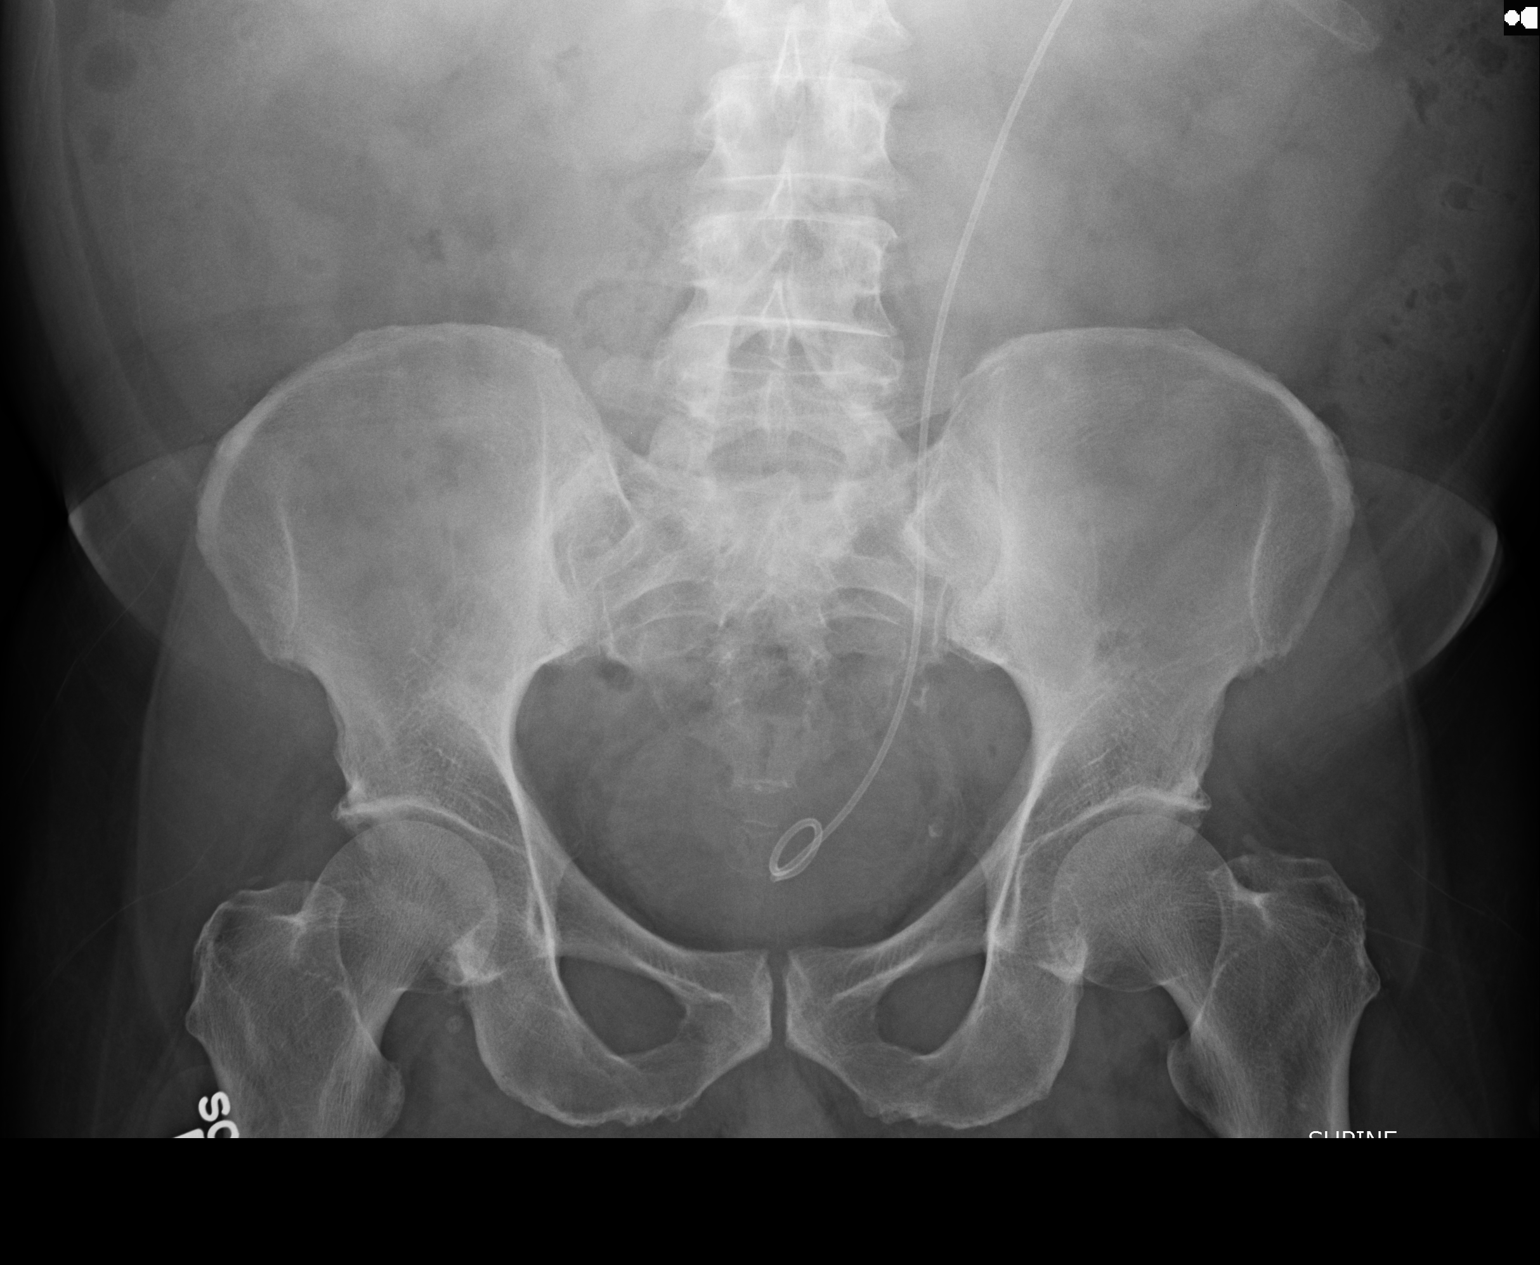

[1 of 1 positions shown; findings below may reference images not displayed]

PROCEDURE:     DXR - DXR KIDNEY URETER BLADDER  - September 20, 2012  [DATE]

RESULT:     Comparison is made to the study of 09/05/2012.

Left-sided double-J ureteral stent is present. No definite stones are seen
adjacent to the proximal portion of the stent. There some minimal density
just inferior to the left sacroiliac joint which could represent vascular
calcification or stone density. Correlate clinically. This is unchanged. The
bowel gas pattern is unremarkable.
IMPRESSION: Please see above.

[REDACTED]

## 2014-06-09 NOTE — Discharge Summary (Signed)
PATIENT NAME:  Colton Gill, Colton Gill MR#:  [id number] DATE OF BIRTH:  1940-07-16  DATE OF ADMISSION:  10/02/2011 DATE OF DISCHARGE:  10/03/2011  FINAL DIAGNOSES:  1. Gastrointestinal bleeding, possibly diverticular versus rectal outlet.  2. Coronary artery disease with prior bypass surgery.  3. Chronic left-sided systolic congestive heart failure.  4. Adult onset diabetes mellitus, uncontrolled.  5. Hyperlipidemia.   HISTORY AND PHYSICAL: Please see dictated admission history and physical.   HOSPITAL COURSE: The patient was admitted after having an episode of bright red blood in the toilet. He also had some vague symptoms of chest and abdominal discomfort, malaise, which were worrisome for unstable angina. He was placed on cardiac monitor and this was followed, which was unremarkable. Cardiac enzymes were followed, which were negative for myocardial infarction. He was given some fluids and generally felt better over the next 12 to 24 hours. He had had two bowel movements since the blood, without further bleeding, but did not have bowel movement while he was here. His diet was advanced and he tolerated this without issues.   His blood counts were followed, he developed some mild anemia, possibly some acute blood loss anemia, though some of this may have been dilutional. He showed no signs or symptoms of heart failure. His diabetes was uncontrolled as his medications had to be cut back as his diet was altered, but this was managed with sliding scale insulin without major issue.   GI was consulted and we discussed the case on the phone. Given recent use of Plavix, he was not a candidate for endoscopy, and given the stopping of the bleeding, they did not feel that they needed to see him at this point. They recommended proton pump inhibitors for now, and anticipation of follow-up with him if he has any further events. Case was also discussed with Cardiology, and the patient was ambulated by me personally,  without any symptoms. He felt so much better the next day. He really didn't feel like he wanted to proceed with a stress test, and after discussing with Cardiology, it did not appear he absolutely needed to pursue this. If he does have any further symptoms, or if it looks like we may need to do colonoscopy sooner, we may consider just doing exercise treadmill test, and we will address this upon follow-up.   These findings were discussed at length with the patient and his family members, including the risks/benefits of following this plan of letting him go home based on the above, and he is quite comfortable with this. It was recommended that he weigh himself daily, calling for more than 2 pound gain in one day or 5 pounds in one week, or increasing symptoms of heart failure, all of which he is familiar with. He should follow a 2 gram sodium, carbohydrate controlled diet and check his blood sugar daily and record this. He will follow-up in our office within the next 1 to 2 weeks. His physical activity should be as tolerated.   DISCHARGE MEDICATIONS:  1. Metformin 1000 mg p.o. b.i.d.  2. Aspirin 81 mg p.o. daily. 3. Pantoprazole 40 mg p.o. daily.  4. Glimepiride 4 mg p.o. b.i.d.  5. Trilipix 135 mg p.o. daily. 6. Actos 45 mg p.o. daily.  7. Ramipril 2.5 mg p.o. daily.  8. Metoprolol 50 mg p.o. b.i.d.  9. Simvastatin 80 mg p.o. at bedtime.  10. Metronidazole 500 mg p.o. t.i.d. x6 days.  11. Cipro 500 mg p.o. t.i.d. x6 days.   He was  given instructions to hold clopidogrel. The risks/benefits of this were discussed, and he is far enough out from his cardiac events and procedures that it was felt reasonably safe to do so. Again, the patient feels comfortable with this plan.   ____________________________ Adin Hector, MD bjk:drc D: 10/03/2011 12:46:12 ET T: 10/04/2011 10:56:59 ET JOB#: [id number]  cc: Tama High III, MD, <Dictator> Ramonita Lab MD ELECTRONICALLY SIGNED 10/05/2011 12:19

## 2014-06-09 NOTE — H&P (Signed)
PATIENT NAME:  Colton Gill, Colton Gill MR#:  [id number] DATE OF BIRTH:  1940/07/18  DATE OF ADMISSION:  10/02/2011  CHIEF COMPLAINT: Abdominal pain and GI bleeding.   HISTORY OF PRESENT ILLNESS: The patient is an 74 year old male with history of coronary artery disease, history of mild chronic left-sided systolic congestive heart failure, as well as diabetes and hyperlipidemia. He states that he was in his usual state of health until yesterday when he had the onset of generalized malaise, some nausea, some discomfort in the lower chest followed by episode of bowels moving with bright red blood filling the toilet. Since then he has felt generalized fatigue as well as continued nausea, perhaps some diaphoresis as well. No real fevers or chills. His bowels have moved further without further bleeding. He comes into the office today for evaluation with examination revealing some left lower quadrant tenderness and the patient appears mildly ill. He is admitted now with GI bleeding complicated by the patient using aspirin and Plavix and with some symptoms that are suggestive of possible unstable angina and is admitted for further evaluation of these.   PAST MEDICAL HISTORY:  1. Coronary artery disease with coronary artery bypass surgery October 2008 through Dr. Cyndia Bent.  2. Chronic left-sided systolic congestive heart failure. Echocardiogram in 2012 revealing LVEF 45% with moderate MR, mild TR.  3. Adult onset diabetes mellitus.  4. Hyperlipidemia.  5. History of leukopenia.  6. History of intermittent vertigo.   ALLERGIES: No known drug allergies.   MEDICATIONS:  1. Ramipril 2.5 mg p.o. daily. 2. Trilipix 135 mg p.o. daily. 3. Actos 45 mg p.o. daily.  4. Aspirin 81 mg p.o. daily.  5. Amaryl 4 mg p.o. b.i.d.  6. Metformin 1000 mg p.o. b.i.d.  7. Lopressor 50 mg p.o. b.i.d.  8. Simvastatin 80 mg p.o. daily.  9. Viagra 100 mg p.o. daily p.r.n.  10. Plavix 75 mg p.o. daily.   SOCIAL HISTORY: Remote tobacco.  Fairly regular alcohol use.   FAMILY HISTORY: Chronic kidney disease and diabetes.   REVIEW OF SYSTEMS: Please see history of present illness. Feels some mild chills but has not had fever. No true chest pain but some discomfort as noted above in the epigastric region. No urinary changes. No edema. No rash. Remainder of the review of systems is negative.   PHYSICAL EXAMINATION:   VITAL SIGNS: Blood pressure 140/50, pulse 65, respirations 12.  GENERAL: Well developed, well nourished male, appears mildly ill.   EYES: Pupils round and reactive to light. Lids and conjunctivae unremarkable.   EARS, NOSE, AND THROAT: External examination unremarkable. Oropharynx is moist without lesions.   NECK: Supple. Trachea midline. No thyromegaly.   CARDIOVASCULAR: Regular rate and rhythm without significant gallops or rubs. Carotid and radial pulses 2+. Distal pulses 2+.   LUNGS: Clear bilaterally. No wheeze or retractions.   ABDOMEN: Soft, slight distention, quiet bowel sounds. Mild tenderness to deep palpation in the left lower quadrant without guard or rebound.   SKIN: No significant rashes or nodules.   LYMPH NODES: No cervical or supraclavicular nodes.   MUSCULOSKELETAL: No clubbing or cyanosis. No significant edema.   NEUROLOGIC: Cranial nerves intact. Motor sensory exams appear intact and symmetrical.   IMPRESSION AND PLAN:  1. GI bleeding, suggest possible lower GI source, possible diverticulitis. He had a colonoscopy in February 2012 which revealed multiple polyps with diverticulosis. Will place on Cipro and Flagyl. Clear liquid diet for now. GI consultation for further assistance.  2. Coronary artery disease. Some of his  symptoms suggest possible unstable angina, though this may be actually diverticulitis instead. Will need to follow his cardiac enzymes. Continue on beta-blocker, ACE inhibitor, and adjust. The risks/benefits of continuing anticoagulation was discussed with the patient and I  think we will need to hold this for now given his acute GI bleeding. If his enzymes are elevated, likely would reinstitute the aspirin if able.  3. Diabetes mellitus. We will reduce the Actos and hold the metformin particularly in the event that he needs any further evaluation which would require IV dye. Continue Amaryl. Will cover with sliding scale insulin for now.   The above plan of action was discussed with the patient and his family members and they are in agreement.   ____________________________ Adin Hector, MD bjk:drc D: 10/02/2011 16:49:48 ET T: 10/02/2011 17:04:56 ET JOB#: [id number]  cc: Adin Hector, MD, <Dictator> Ramonita Lab MD ELECTRONICALLY SIGNED 10/05/2011 12:18

## 2014-06-12 NOTE — Discharge Summary (Signed)
PATIENT NAME:  Colton Gill, Colton Gill MR#:  [id number] DATE OF BIRTH:  1940/07/15  DATE OF ADMISSION:  08/20/2012   DATE OF DISCHARGE:  08/22/2012   FINAL DIAGNOSES: 1. Left 5 mm right ureteral stone with obstruction.  2. Acute renal failure secondary to #1.   3. Coronary artery disease with prior bypass surgery.  4. Adult onset diabetes mellitus, uncontrolled.  5. Hyperlipidemia.  6. Leukopenia with B12 deficiency.  7. Urinary tract infection.   PRINCIPLE PROCEDURE: Cystoscopy with left ureteral stenting.   HISTORY AND PHYSICAL: Please see dictated admission history and physical.   [place]: The patient admitted with severe left lower quadrant abdominal pain with refractory nausea and vomiting. He underwent CT scan, which showed evidence of left ureteral stone with obstruction. His creatinine, which is baseline approximately 1.2 to 1.3, had risen to 1.7 and increased to 2.1 consistent with acute renal failure secondary to the obstruction. He was placed on hydration, pain medications and antibiotics empirically. Urology saw the patient, the patient underwent stenting. Following stenting, his pain resolved and did not require further pain medication. Notes from urology indicate that the patient will need to follow up with them closely next week with anticipation for lithotripsy for definitive treatment of the stone. It is recommended that he not be on aspirin until the procedure is performed.   The patient has been up ambulating the room without difficulty and feels ready to go home; therefore, he will be discharged home in stable condition with his physical activity up as tolerated. He will follow up with Dr. Jacqlyn Larsen next week and in my office is in 7 to 10 days. He should push fluids. He should contact us immediately for any nausea, vomiting. His diet should be 1800 calorie ADA diet. He should check his blood sugar twice a day and record this.   DISCHARGE MEDICATIONS: 1. Actos 45 mg p.o.  daily.  2. Metoprolol 50 mg p.o. b.i.d.  3. Vitamin B12 1000 mcg p.o. daily.  4. Fenofibrate 135 mg p.o. daily.  5. Simvastatin 40 mg p.o. at bedtime. 6. Cipro 250 mg p.o. b.i.d. x14 days.   He was given instructions to hold aspirin and fish oil, which may be restarted after his procedure. He will hold ramipril, which we will restart once his kidney function is normalized. We will hold metformin, he may not be able to go back on this unless his renal function improves significantly. He will hold glimepiride, however, if his glucose is consistently over 150, he may restart this at his prior dose of 4 mg twice a day.  ____________________________ Adin Hector, MD bjk:aw D: 08/22/2012 08:09:00 ET T: 08/22/2012 08:19:27 ET JOB#: [id number]  cc: Denice Bors. Jacqlyn Larsen, MD Adin Hector, MD, <Dictator>    Ramonita Lab MD ELECTRONICALLY SIGNED 08/24/2012 7:46

## 2014-06-12 NOTE — Op Note (Signed)
PATIENT NAME:  Colton Gill, Colton Gill MR#:  [id number] DATE OF BIRTH:  [birth year]  DATE OF PROCEDURE:  08/21/2012  PRINCIPAL DIAGNOSIS:  Left ureterolithiasis, hydronephrosis, acute on chronic renal failure.  POSTOPERATIVE DIAGNOSIS:  Left ureterolithiasis, hydronephrosis, acute on chronic renal failure.   PROCEDURE:  Cystoscopy, left double-J ureteral stent placement.   SURGEON:  Edrick Oh, M.D.   ANESTHESIA:  Laryngeal mask airway anesthesia.   INDICATIONS:  The patient is a 74 year old gentleman with no prior history of nephrolithiasis.  He developed sudden onset of left-sided flank pain with nausea and vomiting.  A CT scan demonstrated a 5 mm proximal left ureteral calculus with obstruction.  His baseline serum creatinine was around 1.3.  He had risen to 2.1 due to the obstruction.  His pain was difficult to control.  He presents for cystoscopy and stent placement.   DESCRIPTION OF PROCEDURE:  After informed consent was obtained, the patient was taken to the Operating Room and placed in the dorsal lithotomy position under laryngeal mask airway anesthesia.  The patient was then prepped and draped in the usual standard fashion.  The 72 French rigid cystoscope was introduced into the urethra under direct vision with no urethral abnormalities noted.  Upon entering the prostatic fossa, moderate bilobar hypertrophy was noted with partial visual obstruction.  The prostate fossa was noted to be relatively short.  No significant median lobe tissue was appreciated.  Upon entering the bladder, the mucosa was inspected in its entirety with no gross mucosal lesions noted.  Bilateral ureteral orifices were well-visualized with no lesions noted.  A flexible tip Glidewire was introduced into the left ureteral orifice and advanced into the upper pole collecting system under fluoroscopic guidance without difficulty.  A 6 French x 24 cm double-J ureteral stent was advanced over the guidewire and easily passed into the renal  pelvis.  This was performed under fluoroscopic guidance.  Adequate curl was noted within the renal pelvis.  Upon withdrawal of the guidewire, adequate curl was also noted within the urinary bladder.  The bladder was drained.  A moderate amount of purulent-appearing material was noted to extrude from the orifice under pressure.  This is consistent with significant obstruction.  The cystoscope was removed.  The patient was returned to the supine position and awakened from laryngeal mask airway anesthesia.  He was taken to the recovery room in stable condition.  There were no problems or complications.  The patient tolerated the procedure well.   ESTIMATED BLOOD LOSS:  None.    ____________________________ Denice Bors. Jacqlyn Larsen, MD bsc:ea D: 08/21/2012 21:37:10 ET T: 08/22/2012 03:25:53 ET JOB#: [id number]  cc: Denice Bors. Jacqlyn Larsen, MD, <Dictator> Denice Bors. Jacqlyn Larsen, MD Denice Bors Rorey Hodges MD ELECTRONICALLY SIGNED 08/28/2012 14:27

## 2014-06-12 NOTE — Consult Note (Signed)
Left stent placed.  purulent drainage under pressure noted.pt remains stable, OK for discharge later today.arrange for LEFT ESWL next thursday.get KUB Monday for location of stone to confirm appropiatness of ESWL.will schedule. No ASA 1 week prior.  Electronic Signatures: Murrell Redden (MD) (Signed on 02-Jul-14 15:51)  Authored   Last Updated: 02-Jul-14 15:53 by Murrell Redden (MD)

## 2014-06-12 NOTE — H&P (Signed)
PATIENT NAME:  Colton Gill, Colton Gill MR#:  [id number] DATE OF BIRTH:  01-14-1941  DATE OF ADMISSION:  08/20/2012  CHIEF COMPLAINT:  Left lower quadrant pain with nausea and vomiting.   HISTORY OF PRESENT ILLNESS:  A 74 year old male with history of coronary artery disease with prior bypass surgery, who developed abrupt onset left lower quadrant pain about 5:00 p.m. yesterday. He states that he was going to the bathroom, having normal bowel movement. He became sweaty, diaphoretic and vomited, with the onset of severe left lower quadrant pain. He has continued to have pain since that time with continued nausea overnight. He has not tried to eat anything. He has seen no blood per rectum, melena. He has had no change in his urinary frequency. He continues to feel sweaty at times. He was evaluated in the office with evidence of left lower quadrant discomfort, with some increased area of density within the abdominal wall on the left side, which appeared to correlate with his pain. His labs were obtained, which were fairly unremarkable, and other than being hypertensive, his pulse was normal, although the patient looked quite uncomfortable. He was sent for CT scan to further investigate while labs were pending. While trying to get down oral contrast for CT scan, he developed refractory nausea and vomiting, and so he is brought in for observation for left lower quadrant abdominal pain with refractory nausea and vomiting.   PAST MEDICAL HISTORY: 1.  Coronary artery disease with prior bypass surgery. 2.  Adult onset diabetes mellitus.  3.  Hyperlipidemia.  4.  History of leukopenia and B12 deficiency.  5.  Recurrent vertigo.  6.  History of GI bleed.  7.  History of cataract surgery.   ALLERGIES:  No known drug allergies.   MEDICATIONS: 1.  Ramipril 2.5 mg p.o. daily.  2.  Trilipix 145 mg p.o. daily.  3.  Actos 45 mg p.o. daily. 4.  Aspirin 81 mg p.o. daily.  5.  Amaryl 4 mg p.o. b.i.d.  6.  Metformin 1000 mg  p.o. b.i.d.  7.  Lopressor 50 mg p.o. b.i.d.  8.  Simvastatin 80 mg p.o. at bedtime.  9.  Viagra 100 mg p.o. daily p.r.n.   SOCIAL HISTORY: Remote tobacco. Occasional alcohol.   FAMILY HISTORY: Chronic kidney disease, diabetes.   REVIEW OF SYSTEMS:  Please see HPI. Has not had fever but has felt chilled. No headache, neck stiffness. No chest pain or dyspnea. No edema or rash. No sick contacts. The remainder of complete review of systems is negative.   PHYSICAL EXAMINATION: VITAL SIGNS: Weight 176, blood pressure 170/72, temperature 97.8, pulse 60.  GENERAL: Well-developed, well-nourished male, appears ill.  EYES: Pupils round and reactive to light. Lids and conjunctivae unremarkable.  EARS, NOSE, AND THROAT: Unremarkable.  OROPHARYNX: Slightly dry without lesions.  NECK: Supple. Trachea midline. No thyromegaly.  CARDIOVASCULAR: Regular rate and rhythm without murmurs, gallops, rubs. Postoperative changes. Carotid and radial pulses 2+.  LUNGS: Clear bilaterally without wheeze or retractions.  ABDOMEN: Distended, with quiet bowel sounds. Tender in the left lower quadrant, with superficial area of fullness and tenderness about midway between the pelvic brim and the left twelfth rib in the midaxillary line.  SKIN:  No significant rashes or nodules.  LYMPH NODES: No cervical or supraclavicular nodes.  MUSCULOSKELETAL: No clubbing, cyanosis or significant edema.  NEUROLOGIC: Cranial nerves are intact. Moves all extremities.   LABORATORY DATA: Amylase, lipase were normal. White count 6.8 with hemoglobin 13.2, which is close to baseline. Liver  enzymes unremarkable. Creatinine 1.7, glucose 175. The remainder of electrolytes normal.   IMPRESSION AND PLAN: 1.  Abdominal pain with refractory nausea and vomiting.  Initiate hydration. Given left lower quadrant area of pain, we will start Cipro and Flagyl empirically versus possible diverticulitis, with adjustment to be made based on CT results. His  chronic kidney disease, stage 3 numbers are slightly worse and we need to make sure there is no obstruction noted on CT. Hydration as noted above and follow electrolytes.  2.  Hypertension.  Hold ACE inhibitor secondary to rising creatinine. Add amlodipine and continue Lopressor.  3.  Coronary artery disease. We will continue aspirin as long as renal function not worsening. Check troponin. Lopressor as noted above. This pain seems quite different than anything he has had with his heart before.  4.  Diabetes mellitus.  Cover with sliding scale insulin and hold all of his oral medications for now. May not be a good candidate for metformin in the future if his creatinine does not improve significantly.   ____________________________ Adin Hector, MD bjk:ce D: 08/20/2012 12:58:08 ET T: 08/20/2012 13:18:59 ET JOB#: [id number]  cc: Adin Hector, MD, <Dictator> Ramonita Lab MD ELECTRONICALLY SIGNED 08/24/2012 7:46

## 2014-06-12 NOTE — Op Note (Signed)
PATIENT NAME:  Colton Gill, Colton Gill MR#:  [id number] DATE OF BIRTH:  28-Oct-1940  DATE OF PROCEDURE:  03/22/2012  PREOPERATIVE DIAGNOSIS: Cataract, left eye.   POSTOPERATIVE DIAGNOSIS: Cataract, left eye.  PROCEDURE PERFORMED: Extracapsular cataract extraction using phacoemulsification with placement of Alcon [id number] 17.5-diopter posterior chamber lens with 2.25 diopters of cylinder, serial #[id number].  SURGEON: Loura Back. Ledon Weihe, M.D.   ANESTHESIA: 4% lidocaine and 0.75% Marcaine in a 50-50 mixture with 10 units/mL of Hylenex added, given as a peribulbar.   ANESTHESIOLOGIST: Vashti Hey, MD   COMPLICATIONS: None.   ESTIMATED BLOOD LOSS: Less than 1 mL.  DESCRIPTION OF PROCEDURE: The patient was brought to the operating room and given topical anesthesia with topical proparacaine. He was set upright and using an ASICO toric marker, the 3 o'clock  and 9 o'clock  positions were marked with the right eye fixating at a distant target. The patient was placed supine, given IV sedation and peribulbar block. He was then prepped and draped in the usual fashion. Vertical rectus muscles were imbricated using 5-0 silk sutures, bridle sutures. Toric marker was centered over the 3 o'clock and 9 o'clock position and 172 degree axis was marked. The 90 degree position was also marked. Limbal peritomy was carried out for 3-quarter clock hour at the 90 degree position and a partial thickness scleral groove was made posteriorly at the surgical limbus. The anterior chamber was entered superotemporally through paracentesis track and through lamellar dissection with a 2.5 mm keratome. DisCoVisc was used to replace the aqueous and continuous tear circular capsulorrhexis was carried out. Hydrodissection was used to loosen the nucleus and phacoemulsification was carried out in a divide and conquer technique. Irrigation-aspiration was used and total ultrasound time was 2 minutes with average power 20.8% and CDE of 41.90.  Irrigation-aspiration was used to remove the residual cortex. The capsular bag was inflated with DisCoVisc and the intraocular lens was inserted using [employer]. The lens was rotated to align the marks at the base of the haptics up with the 172 degree mark. Irrigation-aspiration was used to remove the residual DisCoVisc. The position of the lens was checked and was in good position. Balanced salt was used to inflate the wound and Miostat was injected through the paracentesis track.  0.1 mL of cefuroxime totally one-tenth of a milligram was injected into the anterior chamber under the anterior capsule. The wound was checked for leaks, none were found. The conjunctiva was closed with cautery, the bridle sutures removed, 2 drops of Vigamox were placed in the eye and the patient was discharged to the recovery area in good condition.  ____________________________ Loura Back Karuna Balducci, MD sad:sb D: 03/22/2012 11:28:40 ET T: 03/22/2012 13:29:52 ET JOB#: [id number]  cc: Remo Lipps A. Kaulin Chaves, MD, <Dictator> Martie Lee MD ELECTRONICALLY SIGNED 03/25/2012 12:44

## 2014-06-12 NOTE — Consult Note (Signed)
PATIENT NAME:  Colton Gill, MANERS MR#:  [id number] DATE OF BIRTH:  Jul 26, 1940  DATE OF CONSULTATION:  08/21/2012  CONSULTING PHYSICIAN:  Denice Bors. Jacqlyn Larsen, MD  HISTORY:  Mr. Oehlert is a 74 year old gentleman with no prior history of nephrolithiasis. He developed sudden onset left-sided flank pain. Yesterday, he became diaphoretic and vomiting. He presented for evaluation by primary care. He was sent for a CT scan demonstrating a 5 mm proximal left ureteral calculus with obstruction due to ongoing pain and discomfort with nausea and vomiting. He was admitted for further evaluation and care. He has no prior history of nephrolithiasis. There is a second small stone present within the left kidney. Moderate hydronephrosis was present to the level of the stone. It is visible on preliminary tomogram. He has had continued pain and discomfort. His baseline serum creatinine is 1.3. He has risen to 2.1. He is currently on aspirin as blood thinners for coronary artery disease. He is, therefore, not a candidate for lithotripsy on an elective basis this Thursday. The options of ureteroscopy or lithotripsy on an outpatient basis are the available treatment options. Due to his ongoing pain and worsening renal function, cystoscopy with stent placement has been discussed to temporize the situation until more definitive therapy can be performed. He understands his situation. He denies any other significant prior urological history.   PAST MEDICAL HISTORY:  Significant for coronary artery disease, diabetes mellitus, hyperlipidemia, leukopenia, vitamin B12 deficiency, vertigo, history of GI bleed, cataracts.   PAST SURGICAL HISTORY: Significant for coronary artery bypass and cataract surgery.   ALLERGIES: No known drug allergies.   MEDICATIONS ON ADMISSION: Ramipril 2.5 mg daily, Trilipix 145 mg p.o. daily, Actos 45 mg p.o. daily, aspirin 81 mg daily, Amaryl 4 mg twice daily, metformin 1000 mg twice daily, Lopressor 50 mg twice  daily, simvastatin 80 mg at bedtime, Viagra as needed.   SOCIAL HISTORY: The patient has a remote history of tobacco use. He relates occasional alcohol use. He denies any significant drug use.   PHYSICAL EXAMINATION: VITAL SIGNS: Stable.  HEENT: Within normal limits.  CHEST: Clear to auscultation bilaterally.  CARDIOVASCULAR: Regular rate and rhythm.  ABDOMEN: Soft, nondistended. Mild to moderate tenderness in the left upper quadrant and left CVA region. No palpable mass.  EXTREMITIES: Free range of motion x 4.  NEUROLOGIC: Motor and sensory grossly intact.   ASSESSMENT:  Left ureterolithiasis with obstruction, renal colic, acute on chronic renal failure.   RECOMMENDATION: The options of lithotripsy or ureteroscopic stone removal have been discussed. He will need to be off of aspirin for at least 5 days to be considered for lithotripsy. The laser for ureteroscopic stone removal is not available at this time. We have, therefore, elected to proceed with cystoscopy with stent placement as a temporizing measure until more definitive therapy can be performed. This is due to his ongoing pain and discomfort and worsening renal function. There is no current evidence of sepsis. His white blood cell count is acceptable. He is also afebrile. Risks and benefits have been discussed in detail. He has elected to proceed with cystoscopy and left stent placement. He has been consented for the procedure. He is aware of the potential risks and benefits. He agrees to proceed. He is to notify us if there are any further problems or questions in the interim.     ____________________________ Denice Bors Jacqlyn Larsen, MD bsc:dmm D: 08/21/2012 11:57:00 ET T: 08/21/2012 12:07:11 ET JOB#: [id number]  cc: Denice Bors. Jacqlyn Larsen, MD, <Dictator> Ronnald Shedden S Payten Hobin  MD ELECTRONICALLY SIGNED 08/28/2012 14:27

## 2014-11-09 ENCOUNTER — Encounter: Payer: Self-pay | Admitting: *Deleted

## 2014-11-10 ENCOUNTER — Ambulatory Visit: Payer: Medicare Other | Admitting: Anesthesiology

## 2014-11-10 ENCOUNTER — Encounter: Payer: Self-pay | Admitting: Anesthesiology

## 2014-11-10 ENCOUNTER — Encounter
Admission: RE | Disposition: A | Discharge status: Home / Self Care | Payer: Self-pay | Source: Ambulatory Visit | Attending: Gastroenterology

## 2014-11-10 ENCOUNTER — Ambulatory Visit
Admission: RE | Admit: 2014-11-10 | Discharge: 2014-11-10 | Disposition: A | Discharge status: Home / Self Care | Payer: Medicare Other | Source: Ambulatory Visit | Attending: Gastroenterology | Admitting: Gastroenterology

## 2014-11-10 DIAGNOSIS — Z86010 Personal history of colonic polyps: Secondary | ICD-10-CM | POA: Diagnosis present

## 2014-11-10 DIAGNOSIS — Z7982 Long term (current) use of aspirin: Secondary | ICD-10-CM | POA: Insufficient documentation

## 2014-11-10 DIAGNOSIS — N189 Chronic kidney disease, unspecified: Secondary | ICD-10-CM | POA: Diagnosis not present

## 2014-11-10 DIAGNOSIS — I2510 Atherosclerotic heart disease of native coronary artery without angina pectoris: Secondary | ICD-10-CM | POA: Insufficient documentation

## 2014-11-10 DIAGNOSIS — D122 Benign neoplasm of ascending colon: Secondary | ICD-10-CM | POA: Insufficient documentation

## 2014-11-10 DIAGNOSIS — E785 Hyperlipidemia, unspecified: Secondary | ICD-10-CM | POA: Diagnosis not present

## 2014-11-10 DIAGNOSIS — Z79899 Other long term (current) drug therapy: Secondary | ICD-10-CM | POA: Diagnosis not present

## 2014-11-10 DIAGNOSIS — D120 Benign neoplasm of cecum: Secondary | ICD-10-CM | POA: Diagnosis not present

## 2014-11-10 DIAGNOSIS — E1122 Type 2 diabetes mellitus with diabetic chronic kidney disease: Secondary | ICD-10-CM | POA: Diagnosis not present

## 2014-11-10 DIAGNOSIS — Z794 Long term (current) use of insulin: Secondary | ICD-10-CM | POA: Insufficient documentation

## 2014-11-10 DIAGNOSIS — K5730 Diverticulosis of large intestine without perforation or abscess without bleeding: Secondary | ICD-10-CM | POA: Insufficient documentation

## 2014-11-10 DIAGNOSIS — D124 Benign neoplasm of descending colon: Secondary | ICD-10-CM | POA: Diagnosis not present

## 2014-11-10 DIAGNOSIS — R42 Dizziness and giddiness: Secondary | ICD-10-CM | POA: Insufficient documentation

## 2014-11-10 HISTORY — DX: Hyperlipidemia, unspecified: E78.5

## 2014-11-10 HISTORY — DX: Atherosclerotic heart disease of native coronary artery without angina pectoris: I25.10

## 2014-11-10 HISTORY — DX: Type 2 diabetes mellitus without complications: E11.9

## 2014-11-10 HISTORY — PX: COLONOSCOPY WITH PROPOFOL: SHX5780

## 2014-11-10 HISTORY — DX: Male erectile dysfunction, unspecified: N52.9

## 2014-11-10 HISTORY — DX: Dizziness and giddiness: R42

## 2014-11-10 HISTORY — DX: Chronic kidney disease, unspecified: N18.9

## 2014-11-10 LAB — GLUCOSE, CAPILLARY: GLUCOSE-CAPILLARY: 139 mg/dL — AB (ref 65–99)

## 2014-11-10 SURGERY — COLONOSCOPY WITH PROPOFOL
Anesthesia: General

## 2014-11-10 MED ORDER — FENTANYL CITRATE (PF) 100 MCG/2ML IJ SOLN
INTRAMUSCULAR | Status: DC | PRN
  Administered 2014-11-10: 50 ug via INTRAVENOUS

## 2014-11-10 MED ORDER — METOPROLOL TARTRATE 50 MG PO TABS
50.0000 mg | ORAL_TABLET | Freq: Once | ORAL | Status: AC
  Administered 2014-11-10: 50 mg via ORAL

## 2014-11-10 MED ORDER — LIDOCAINE HCL (CARDIAC) 20 MG/ML IV SOLN
INTRAVENOUS | Status: DC | PRN
  Administered 2014-11-10 (×2): 40 mg via INTRAVENOUS

## 2014-11-10 MED ORDER — PROPOFOL INFUSION 10 MG/ML OPTIME
INTRAVENOUS | Status: DC | PRN
  Administered 2014-11-10: 100 ug/kg/min via INTRAVENOUS

## 2014-11-10 MED ORDER — SODIUM CHLORIDE 0.9 % IV SOLN
INTRAVENOUS | Status: DC
  Administered 2014-11-10: 1000 mL via INTRAVENOUS
  Administered 2014-11-10: 08:00:00 via INTRAVENOUS

## 2014-11-10 MED ORDER — MIDAZOLAM HCL 5 MG/5ML IJ SOLN
INTRAMUSCULAR | Status: DC | PRN
  Administered 2014-11-10: 1 mg via INTRAVENOUS

## 2014-11-10 MED ORDER — SODIUM CHLORIDE 0.9 % IV SOLN: INTRAVENOUS | Status: DC

## 2014-11-10 MED ORDER — PROPOFOL 10 MG/ML IV BOLUS
INTRAVENOUS | Status: DC | PRN
  Administered 2014-11-10: 20 mg via INTRAVENOUS
  Administered 2014-11-10: 40 mg via INTRAVENOUS

## 2014-11-10 MED ORDER — METOPROLOL TARTRATE 50 MG PO TABS
ORAL_TABLET | ORAL | Status: AC
  Filled 2014-11-10: qty 1

## 2014-11-10 NOTE — Op Note (Signed)
[facility] Gastroenterology Patient Name: Colton Gill Procedure Date: 11/10/2014 7:51 AM MRN: [id number] Account #: [id number] Date of Birth: [birth year] Admit Type: Outpatient Age: 74 Room: [facility] ENDO ROOM 3 Gender: Male Note Status: Finalized Procedure:         Colonoscopy Indications:       Personal history of colonic polyps Providers:         Lollie Sails, MD Referring MD:      Ramonita Lab, MD (Referring MD) Medicines:         Monitored Anesthesia Care Complications:     No immediate complications. Procedure:         Pre-Anesthesia Assessment:                    - ASA Grade Assessment: III - A patient with severe                     systemic disease.                    After obtaining informed consent, the colonoscope was                     passed under direct vision. Throughout the procedure, the                     patient's blood pressure, pulse, and oxygen saturations                     were monitored continuously. The Colonoscope was                     introduced through the anus and advanced to the the cecum,                     identified by appendiceal orifice and ileocecal valve. The                     colonoscopy was performed without difficulty. The quality                     of the bowel preparation was fair. Findings:      A 4 mm polyp was found in the descending colon. The polyp was flat. The       polyp was removed with a cold snare. Resection and retrieval were       complete.      A 5 mm polyp was found in the proximal ascending colon. The polyp was       sessile. The polyp was removed with a cold snare. Resection and       retrieval were complete.      A 3 mm polyp was found in the cecum. The polyp was sessile. The polyp       was removed with a cold biopsy forceps. Resection and retrieval were       complete.      A 2 mm polyp was found in the distal descending colon. The polyp was       sessile. The polyp was removed with a  cold biopsy forceps. Resection and       retrieval were complete.      Multiple small-mouthed diverticula were found in the sigmoid colon and       in the descending colon.      The retroflexed view of  the distal rectum and anal verge was normal and       showed no anal or rectal abnormalities.      The digital rectal exam was normal. Impression:        - One 4 mm polyp in the descending colon. Resected and                     retrieved.                    - One 5 mm polyp in the proximal ascending colon. Resected                     and retrieved.                    - One 3 mm polyp in the cecum. Resected and retrieved.                    - One 2 mm polyp in the distal descending colon. Resected                     and retrieved.                    - Diverticulosis in the sigmoid colon and in the                     descending colon.                    - The distal rectum and anal verge are normal on                     retroflexion view. Recommendation:    - Await pathology results. Procedure Code(s): --- Professional ---                    [phone], Colonoscopy, flexible; with removal of tumor(s),                     polyp(s), or other lesion(s) by snare technique                    45380, 52, Colonoscopy, flexible; with biopsy, single or                     multiple Diagnosis Code(s): --- Professional ---                    211.3, Benign neoplasm of colon                    V12.72, Personal history of colonic polyps                    562.10, Diverticulosis of colon (without mention of                     hemorrhage) CPT copyright 2014 American Medical Association. All rights reserved. The codes documented in this report are preliminary and upon coder review may  be revised to meet current compliance requirements. Lollie Sails, MD 11/10/2014 8:57:59 AM This report has been signed electronically. Number of Addenda: 0 Note Initiated On: 11/10/2014 7:51 AM Scope Withdrawal Time:  0 hours 14 minutes 25 seconds  Total Procedure Duration: 0 hours 33 minutes 19 seconds  [facility]

## 2014-11-10 NOTE — H&P (Signed)
Outpatient short stay form Pre-procedure 11/10/2014 8:07 AM Lollie Sails MD  Primary Physician: Dr. Ramonita Lab  Reason for visit:  Colonoscopy  History of present illness:  Patient is a 74 year old male with a personal history of adenomatous colon polyps. His last colonoscopy was about 4 years ago. He tolerated his prep well although he did get some nausea with it. Does take 81 mg aspirin but has held that for 3 days. No other aspirin or blood thinning products.    Current facility-administered medications:  .  0.9 %  sodium chloride infusion, , Intravenous, Continuous, Lollie Sails, MD, Last Rate: 20 mL/hr at 11/10/14 0727, 1,000 mL at 11/10/14 0727 .  0.9 %  sodium chloride infusion, , Intravenous, Continuous, Lollie Sails, MD  Prescriptions prior to admission  Medication Sig Dispense Refill Last Dose  . amoxicillin (AMOXIL) 500 MG capsule Take 500 mg by mouth 3 (three) times daily.   Past Week at Unknown time  . aspirin 81 MG tablet Take 81 mg by mouth daily.   Past Week at Unknown time  . Choline Fenofibrate 135 MG capsule Take 135 mg by mouth daily.   Past Week at Unknown time  . glimepiride (AMARYL) 4 MG tablet Take 4 mg by mouth daily with breakfast.   Past Week at Unknown time  . insulin detemir (LEVEMIR) 100 UNIT/ML injection Inject into the skin at bedtime.   Past Week at Unknown time  . metoprolol (LOPRESSOR) 50 MG tablet Take 50 mg by mouth 2 (two) times daily.   Past Week at Unknown time  . pioglitazone (ACTOS) 30 MG tablet Take 30 mg by mouth daily.   Past Week at Unknown time  . ramipril (ALTACE) 2.5 MG capsule Take 2.5 mg by mouth daily.   Past Week at Unknown time  . sildenafil (REVATIO) 20 MG tablet Take 20 mg by mouth 3 (three) times daily.   Past Week at Unknown time  . simvastatin (ZOCOR) 80 MG tablet Take 80 mg by mouth daily.   Past Week at Unknown time  . vitamin B-12 (CYANOCOBALAMIN) 1000 MCG tablet Take 1,000 mcg by mouth daily.   Past Week at  Unknown time     No Known Allergies   Past Medical History  Diagnosis Date  . Hyperlipidemia   . Coronary artery disease   . Vertigo   . Erectile dysfunction   . Chronic kidney disease   . Diabetes mellitus without complication     Review of systems:      Physical Exam    Heart and lungs: Regular rate and rhythm without rub or gallop, lungs are bilaterally clear    HEENT: Normocephalic atraumatic eyes are anicteric    Other:     Pertinant exam for procedure: Nontender nondistended bowel sounds positive normoactive    Planned proceedures: Colonoscopy and indicated procedures I have discussed the risks benefits and complications of procedures to include not limited to bleeding, infection, perforation and the risk of sedation and the patient wishes to proceed.    Lollie Sails, MD Gastroenterology 11/10/2014  8:07 AM

## 2014-11-10 NOTE — Transfer of Care (Signed)
Immediate Anesthesia Transfer of Care Note  Patient: Colton Gill  Procedure(s) Performed: Procedure(s): COLONOSCOPY WITH PROPOFOL (N/A)  Patient Location: PACU  Anesthesia Type:General  Level of Consciousness: awake and alert   Airway & Oxygen Therapy: Patient Spontanous Breathing and Patient connected to nasal cannula oxygen  Post-op Assessment: Report given to RN and Post -op Vital signs reviewed and stable  Post vital signs: Reviewed and stable  Last Vitals:  Filed Vitals:   11/10/14 0713  BP: 136/60  Pulse: 74  Temp: 35.8 C  Resp: 16    Complications: No apparent anesthesia complications

## 2014-11-10 NOTE — Anesthesia Preprocedure Evaluation (Signed)
Anesthesia Evaluation  Patient identified by MRN, date of birth, ID band Patient awake    Reviewed: Allergy & Precautions, NPO status , Patient's Chart, lab work & pertinent test results, reviewed documented beta blocker date and time   Airway Mallampati: II  TM Distance: >3 FB     Dental  (+) Chipped   Pulmonary           Cardiovascular hypertension, Pt. on medications and Pt. on home beta blockers + CAD       Neuro/Psych    GI/Hepatic   Endo/Other  diabetes, Type 2  Renal/GU Renal InsufficiencyRenal disease     Musculoskeletal   Abdominal   Peds  Hematology   Anesthesia Other Findings   Reproductive/Obstetrics                             Anesthesia Physical Anesthesia Plan  ASA: III  Anesthesia Plan: General   Post-op Pain Management:    Induction: Intravenous  Airway Management Planned: Nasal Cannula  Additional Equipment:   Intra-op Plan:   Post-operative Plan:   Informed Consent: I have reviewed the patients History and Physical, chart, labs and discussed the procedure including the risks, benefits and alternatives for the proposed anesthesia with the patient or authorized representative who has indicated his/her understanding and acceptance.     Plan Discussed with: CRNA  Anesthesia Plan Comments:         Anesthesia Quick Evaluation

## 2014-11-10 NOTE — Anesthesia Postprocedure Evaluation (Signed)
  Anesthesia Post-op Note  Patient: Colton Gill  Procedure(s) Performed: Procedure(s): COLONOSCOPY WITH PROPOFOL (N/A)  Anesthesia type:General  Patient location: PACU  Post pain: Pain level controlled  Post assessment: Post-op Vital signs reviewed, Patient's Cardiovascular Status Stable, Respiratory Function Stable, Patent Airway and No signs of Nausea or vomiting  Post vital signs: Reviewed and stable  Last Vitals:  Filed Vitals:   11/10/14 0930  BP: 127/63  Pulse: 48  Temp:   Resp: 12    Level of consciousness: awake, alert  and patient cooperative  Complications: No apparent anesthesia complications

## 2014-11-11 ENCOUNTER — Encounter: Payer: Self-pay | Admitting: Gastroenterology

## 2014-11-11 LAB — SURGICAL PATHOLOGY

## 2016-03-06 NOTE — Pre-Procedure Instructions (Signed)
Echo and Stress done 02/25/16 in care everywhere

## 2016-03-07 NOTE — H&P (Signed)
See scanned note.

## 2016-03-08 ENCOUNTER — Ambulatory Visit
Admission: RE | Admit: 2016-03-08 | Discharge: 2016-03-08 | Disposition: A | Discharge status: Home / Self Care | Payer: Medicare Other | Source: Ambulatory Visit | Attending: Ophthalmology | Admitting: Ophthalmology

## 2016-03-08 ENCOUNTER — Ambulatory Visit: Payer: Medicare Other | Admitting: Anesthesiology

## 2016-03-08 ENCOUNTER — Encounter: Payer: Self-pay | Admitting: *Deleted

## 2016-03-08 ENCOUNTER — Encounter
Admission: RE | Disposition: A | Discharge status: Home / Self Care | Payer: Self-pay | Source: Ambulatory Visit | Attending: Ophthalmology

## 2016-03-08 DIAGNOSIS — N189 Chronic kidney disease, unspecified: Secondary | ICD-10-CM | POA: Insufficient documentation

## 2016-03-08 DIAGNOSIS — H2511 Age-related nuclear cataract, right eye: Secondary | ICD-10-CM | POA: Insufficient documentation

## 2016-03-08 DIAGNOSIS — E785 Hyperlipidemia, unspecified: Secondary | ICD-10-CM | POA: Diagnosis not present

## 2016-03-08 DIAGNOSIS — E1122 Type 2 diabetes mellitus with diabetic chronic kidney disease: Secondary | ICD-10-CM | POA: Insufficient documentation

## 2016-03-08 DIAGNOSIS — K219 Gastro-esophageal reflux disease without esophagitis: Secondary | ICD-10-CM | POA: Diagnosis not present

## 2016-03-08 DIAGNOSIS — H2510 Age-related nuclear cataract, unspecified eye: Secondary | ICD-10-CM | POA: Diagnosis present

## 2016-03-08 HISTORY — PX: CATARACT EXTRACTION W/PHACO: SHX586

## 2016-03-08 LAB — GLUCOSE, CAPILLARY: Glucose-Capillary: 206 mg/dL — ABNORMAL HIGH (ref 65–99)

## 2016-03-08 SURGERY — PHACOEMULSIFICATION, CATARACT, WITH IOL INSERTION
Anesthesia: Monitor Anesthesia Care | Site: Eye | Laterality: Right | Wound class: Clean

## 2016-03-08 MED ORDER — SODIUM CHLORIDE 0.9 % IV SOLN
INTRAVENOUS | Status: DC
  Administered 2016-03-08: 07:00:00 via INTRAVENOUS

## 2016-03-08 MED ORDER — LIDOCAINE HCL (PF) 4 % IJ SOLN
INTRAMUSCULAR | Status: AC
  Filled 2016-03-08: qty 5

## 2016-03-08 MED ORDER — CYCLOPENTOLATE HCL 2 % OP SOLN
OPHTHALMIC | Status: AC
  Filled 2016-03-08: qty 2

## 2016-03-08 MED ORDER — CEFUROXIME OPHTHALMIC INJECTION 1 MG/0.1 ML
INJECTION | OPHTHALMIC | Status: DC | PRN
  Administered 2016-03-08: 0.1 mL via INTRACAMERAL

## 2016-03-08 MED ORDER — POVIDONE-IODINE 5 % OP SOLN
OPHTHALMIC | Status: DC | PRN
  Administered 2016-03-08: 1 via OPHTHALMIC

## 2016-03-08 MED ORDER — PHENYLEPHRINE HCL 10 % OP SOLN
1.0000 [drp] | OPHTHALMIC | Status: DC | PRN
  Administered 2016-03-08 (×3): 1 [drp] via OPHTHALMIC

## 2016-03-08 MED ORDER — PROPARACAINE HCL 0.5 % OP SOLN
OPHTHALMIC | Status: DC | PRN
  Administered 2016-03-08: 1 [drp] via OPHTHALMIC

## 2016-03-08 MED ORDER — BUPIVACAINE HCL (PF) 0.75 % IJ SOLN
INTRAMUSCULAR | Status: AC
  Filled 2016-03-08: qty 10

## 2016-03-08 MED ORDER — CYCLOPENTOLATE HCL 2 % OP SOLN
1.0000 [drp] | OPHTHALMIC | Status: DC | PRN
  Administered 2016-03-08 (×3): 1 [drp] via OPHTHALMIC

## 2016-03-08 MED ORDER — NA CHONDROIT SULF-NA HYALURON 40-17 MG/ML IO SOLN
INTRAOCULAR | Status: AC
  Filled 2016-03-08: qty 1

## 2016-03-08 MED ORDER — LIDOCAINE HCL (PF) 4 % IJ SOLN
INTRAOCULAR | Status: DC | PRN
  Administered 2016-03-08: 4 mL via OPHTHALMIC

## 2016-03-08 MED ORDER — MOXIFLOXACIN HCL 0.5 % OP SOLN
OPHTHALMIC | Status: DC
Start: 2016-03-08 — End: 2016-03-08
  Filled 2016-03-08: qty 3

## 2016-03-08 MED ORDER — NA CHONDROIT SULF-NA HYALURON 40-17 MG/ML IO SOLN
INTRAOCULAR | Status: DC | PRN
  Administered 2016-03-08: 1 mL via INTRAOCULAR

## 2016-03-08 MED ORDER — POVIDONE-IODINE 5 % OP SOLN
OPHTHALMIC | Status: AC
  Filled 2016-03-08: qty 30

## 2016-03-08 MED ORDER — MIDAZOLAM HCL 2 MG/2ML IJ SOLN
INTRAMUSCULAR | Status: AC
  Filled 2016-03-08: qty 2

## 2016-03-08 MED ORDER — CEFUROXIME OPHTHALMIC INJECTION 1 MG/0.1 ML
INJECTION | OPHTHALMIC | Status: AC
  Filled 2016-03-08: qty 0.1

## 2016-03-08 MED ORDER — EPINEPHRINE PF 1 MG/ML IJ SOLN
INTRAMUSCULAR | Status: AC
  Filled 2016-03-08: qty 1

## 2016-03-08 MED ORDER — TETRACAINE HCL 0.5 % OP SOLN
OPHTHALMIC | Status: AC
  Filled 2016-03-08: qty 2

## 2016-03-08 MED ORDER — PROPARACAINE HCL 0.5 % OP SOLN
OPHTHALMIC | Status: AC
  Filled 2016-03-08: qty 15

## 2016-03-08 MED ORDER — TETRACAINE HCL 0.5 % OP SOLN
OPHTHALMIC | Status: DC | PRN
  Administered 2016-03-08: 1 [drp] via OPHTHALMIC

## 2016-03-08 MED ORDER — HYALURONIDASE HUMAN 150 UNIT/ML IJ SOLN
INTRAMUSCULAR | Status: AC
  Filled 2016-03-08: qty 1

## 2016-03-08 MED ORDER — ACETAZOLAMIDE SODIUM 500 MG IJ SOLR
INTRAMUSCULAR | Status: AC
  Filled 2016-03-08: qty 500

## 2016-03-08 MED ORDER — MOXIFLOXACIN HCL 0.5 % OP SOLN
1.0000 [drp] | OPHTHALMIC | Status: DC | PRN
  Administered 2016-03-08 (×2): 1 [drp] via OPHTHALMIC

## 2016-03-08 MED ORDER — LIDOCAINE HCL (PF) 4 % IJ SOLN
INTRAMUSCULAR | Status: DC | PRN
  Administered 2016-03-08: 9 mL via OPHTHALMIC

## 2016-03-08 MED ORDER — EPINEPHRINE PF 1 MG/ML IJ SOLN
INTRAOCULAR | Status: DC | PRN
  Administered 2016-03-08: 1 mL via OPHTHALMIC

## 2016-03-08 MED ORDER — MIDAZOLAM HCL 2 MG/2ML IJ SOLN
INTRAMUSCULAR | Status: DC | PRN
  Administered 2016-03-08 (×2): 1 mg via INTRAVENOUS

## 2016-03-08 MED ORDER — CARBACHOL 0.01 % IO SOLN
INTRAOCULAR | Status: DC | PRN
  Administered 2016-03-08: 0.5 mL via INTRAOCULAR

## 2016-03-08 MED ORDER — PHENYLEPHRINE HCL 10 % OP SOLN
OPHTHALMIC | Status: AC
  Filled 2016-03-08: qty 5

## 2016-03-08 MED ORDER — ALFENTANIL 500 MCG/ML IJ INJ
INJECTION | INTRAMUSCULAR | Status: DC | PRN
  Administered 2016-03-08: 250 ug via INTRAVENOUS

## 2016-03-08 SURGICAL SUPPLY — 36 items
CANNULA ANT/CHMB 27GA (MISCELLANEOUS) ×2 IMPLANT
CARTRIDGE IOL B-STYLE MONARCH (MISCELLANEOUS) ×2 IMPLANT
CORD BIP STRL DISP 12FT (MISCELLANEOUS) ×2 IMPLANT
CUP MEDICINE 2OZ PLAST GRAD ST (MISCELLANEOUS) ×2 IMPLANT
DRAPE XRAY CASSETTE 23X24 (DRAPES) ×2 IMPLANT
ERASER HMR WETFIELD 18G (MISCELLANEOUS) ×2 IMPLANT
GLOVE BIO SURGEON STRL SZ8 (GLOVE) ×2 IMPLANT
GLOVE SURG LX 6.5 MICRO (GLOVE) ×1
GLOVE SURG LX 8.0 MICRO (GLOVE) ×1
GLOVE SURG LX STRL 6.5 MICRO (GLOVE) ×1 IMPLANT
GLOVE SURG LX STRL 8.0 MICRO (GLOVE) ×1 IMPLANT
GOWN STRL REUS W/ TWL LRG LVL3 (GOWN DISPOSABLE) ×1 IMPLANT
GOWN STRL REUS W/ TWL XL LVL3 (GOWN DISPOSABLE) ×1 IMPLANT
GOWN STRL REUS W/TWL LRG LVL3 (GOWN DISPOSABLE) ×1
GOWN STRL REUS W/TWL XL LVL3 (GOWN DISPOSABLE) ×1
LENS IOL ACRSF IQ TRC 6 17.0 (Intraocular Lens) IMPLANT
LENS IOL ACRSF MP 17.0 (Intraocular Lens) ×1 IMPLANT
LENS IOL ACRYSOF IQ TORIC 17.0 (Intraocular Lens) IMPLANT
LENS IOL ACRYSOF POST 17.0 (Intraocular Lens) ×2 IMPLANT
LENS IOL IQ TORIC 6 17.0 (Intraocular Lens) IMPLANT
PACK CATARACT (MISCELLANEOUS) ×2 IMPLANT
PACK CATARACT DINGLEDEIN LX (MISCELLANEOUS) ×2 IMPLANT
PACK EYE AFTER SURG (MISCELLANEOUS) ×2 IMPLANT
PACK VIT ANT 23G (MISCELLANEOUS) ×4 IMPLANT
SHLD EYE VISITEC  UNIV (MISCELLANEOUS) ×2 IMPLANT
SOL BSS BAG (MISCELLANEOUS) ×2
SOL PREP PVP 2OZ (MISCELLANEOUS) ×2
SOLUTION BSS BAG (MISCELLANEOUS) ×1 IMPLANT
SOLUTION PREP PVP 2OZ (MISCELLANEOUS) ×1 IMPLANT
SUT ETHILON 10 0 CS140 6 (SUTURE) ×2 IMPLANT
SUT SILK 5-0 (SUTURE) ×2 IMPLANT
SYR 3ML LL SCALE MARK (SYRINGE) ×2 IMPLANT
SYR 5ML LL (SYRINGE) ×2 IMPLANT
SYR TB 1ML 27GX1/2 LL (SYRINGE) ×2 IMPLANT
WATER STERILE IRR 250ML POUR (IV SOLUTION) ×2 IMPLANT
WIPE NON LINTING 3.25X3.25 (MISCELLANEOUS) ×2 IMPLANT

## 2016-03-08 NOTE — Anesthesia Post-op Follow-up Note (Cosign Needed)
Anesthesia QCDR form completed.        

## 2016-03-08 NOTE — Discharge Instructions (Signed)
Eye Surgery Discharge Instructions  Expect mild scratchy sensation or mild soreness. DO NOT RUB YOUR EYE!  The day of surgery:  Minimal physical activity, but bed rest is not required  No reading, computer work, or close hand work  No bending, lifting, or straining.  May watch TV  For 24 hours:  No driving, legal decisions, or alcoholic beverages  Safety precautions  Eat anything you prefer: It is better to start with liquids, then soup then solid foods.  _____ Eye patch should be worn until postoperative exam tomorrow.  ____ Solar shield eyeglasses should be worn for comfort in the sunlight/patch while sleeping  Resume all regular medications including aspirin or Coumadin if these were discontinued prior to surgery. You may shower, bathe, shave, or wash your hair. Tylenol may be taken for mild discomfort.  Call your doctor if you experience significant pain, nausea, or vomiting, fever > 101 or other signs of infection. [phone] or [phone] Specific instructions:  Follow-up Information    Marcelles Clinard, MD Follow up.   Specialty:  Ophthalmology Why:  January 18 at 8:20am Contact information: [address]   [place] [address] [phone]

## 2016-03-08 NOTE — Anesthesia Preprocedure Evaluation (Signed)
Anesthesia Evaluation  Patient identified by MRN, date of birth, ID band Patient awake    Reviewed: Allergy & Precautions, H&P , NPO status , Patient's Chart, lab work & pertinent test results  Airway Mallampati: III  TM Distance: <3 FB Neck ROM: limited    Dental  (+) Poor Dentition, Chipped   Pulmonary neg pulmonary ROS, neg shortness of breath,    Pulmonary exam normal breath sounds clear to auscultation       Cardiovascular Exercise Tolerance: Good (-) angina+ CAD  (-) DOE Normal cardiovascular exam Rhythm:regular Rate:Normal     Neuro/Psych negative neurological ROS  negative psych ROS   GI/Hepatic negative GI ROS, Neg liver ROS, neg GERD  ,  Endo/Other  diabetes, Type 2  Renal/GU Renal disease     Musculoskeletal   Abdominal   Peds  Hematology negative hematology ROS (+)   Anesthesia Other Findings Past Medical History: No date: Chronic kidney disease No date: Coronary artery disease No date: Diabetes mellitus without complication (HCC) No date: Erectile dysfunction No date: Hyperlipidemia No date: Vertigo  Past Surgical History: No date: COLONOSCOPY 11/10/2014: COLONOSCOPY WITH PROPOFOL N/A     Comment: Procedure: COLONOSCOPY WITH PROPOFOL;                Surgeon: Lollie Sails, MD;  Location: [facility]              ENDOSCOPY;  Service: Endoscopy;  Laterality:               N/A; No date: CORONARY ARTERY BYPASS GRAFT No date: EYE SURGERY  BMI    Body Mass Index:  31.24 kg/m      Reproductive/Obstetrics negative OB ROS                             Anesthesia Physical Anesthesia Plan  ASA: III  Anesthesia Plan: MAC   Post-op Pain Management:    Induction:   Airway Management Planned:   Additional Equipment:   Intra-op Plan:   Post-operative Plan:   Informed Consent: I have reviewed the patients History and Physical, chart, labs and discussed the procedure  including the risks, benefits and alternatives for the proposed anesthesia with the patient or authorized representative who has indicated his/her understanding and acceptance.     Plan Discussed with: Anesthesiologist, CRNA and Surgeon  Anesthesia Plan Comments:         Anesthesia Quick Evaluation

## 2016-03-08 NOTE — Op Note (Signed)
Date of Surgery: 03/08/2016 Date of Dictation: 03/08/2016 9:46 AM Pre-operative Diagnosis:  Nuclear Sclerotic Cataract and Posterior Subcapsular Cataract right Eye Post-operative Diagnosis: same Procedure performed: Extra-capsular Cataract Extraction (ECCE) with placement of a posterior chamber intraocular lens (IOL) right Eye IOL:  Implant Name Type Inv. Item Serial No. Manufacturer Lot No. LRB No. Used  LENS IOL TORIC 17.0 - [id number] Intraocular Lens LENS IOL TORIC 17.0 [id number] ALCON  Right 1  LENS IOL POST 17.0 - [id number] Intraocular Lens LENS IOL POST 17.0 [id number] ALCON   Right 1   Anesthesia: 2% Lidocaine and 4% Marcaine in a 50/50 mixture with 10 unites/ml of Hylenex given as a peribulbar Anesthesiologist: Anesthesiologist: Andria Frames, MD CRNA: Hedda Slade, CRNA Complications: none Estimated Blood Loss: less than 1 ml  Description of procedure:  The patient was given anesthesia and sedation via intravenous access. The patient was then prepped and draped in the usual fashion. A 25-gauge needle was bent for initiating the capsulorhexis. A 5-0 silk suture was placed through the conjunctiva superior and inferiorly to serve as bridle sutures. Hemostasis was obtained at the superior limbus using an eraser cautery. A partial thickness groove was made at the anterior surgical limbus with a 64 Beaver blade and this was dissected anteriorly with an Avaya. The anterior chamber was entered at 10 o'clock with a 1.0 mm paracentesis knife and through the lamellar dissection with a 2.6 mm Alcon keratome. Epi-Shugarcaine 0.5 CC [9 cc BSS Plus (Alcon), 3 cc 4% preservative-free lidocaine (Hospira) and 4 cc 1:1000 preservative-free, bisulfite-free epinephrine] was injected into the anterior chamber via the paracentesis tract. Epi-Shugarcaine 0.5 CC [9 cc BSS Plus (Alcon), 3 cc 4% preservative-free lidocaine (Hospira) and 4 cc 1:1000 preservative-free, bisulfite-free  epinephrine] was injected into the anterior chamber via the paracentesis tract. DiscoVisc was injected to replace the aqueous and a continuous tear curvilinear capsulorhexis was performed using a bent 25-gauge needle.  Balance salt on a syringe was used to perform hydro-dissection. The nucleus was turned easily. There appeared to be a slight dip to the superior part of the nucleus. The phaco tip was placed in the eye and the nucleus immediately prolapsed through a rent in the posterior capsule into the vitreous. A brief attempt was made to engage it with the phaco tip, but the nucleus could not be captured with the tip.   The vitrector was brought to the table and the paracentesis was enlarged with the paracentesis knife to allow introduction of the irrigation cannula. The vitrector was introduced through the 2.6 mm incision and a vitrectromy was carried out using the cut-I/A mode. A Weck Cel was placed at the wound to determine that no vitreous was at the wound. DiscoVisc was then placed in the anterior chamber to fill it.    Procedure(s) with comments: CATARACT EXTRACTION PHACO AND INTRAOCULAR LENS PLACEMENT (IOC) (Right) - [place] 02.0 AP% 11.4 CDE 0.57 Fluid pack lot # [id number].   Irrigation/aspiration was used to remove the residual cortex. The chamber was filled with DiscoVisc separating the anterior capsule from the iris. The intraocular lens was inserted into the capsular bag using Monarch cartridge. Irrigation/aspiration was used to remove the residual DiscoVisc. A single 10-0 nylon was placed across the main incision and another across the enlarged paracentesis site. The wound was inflated with balanced salt and checked for leaks. None were found. Miostat was injected via the paracentesis track and 0.1 ml of cefuroxime containing 1 mg of drug  was  injected via the paracentesis track. The wound was checked for leaks again and none were found.   The bridal sutures were removed and two drops of  Vigamox were placed on the eye. An eye shield was placed to protect the eye and the patient was discharged to the recovery area in good condition.   Taylar Hartsough MD

## 2016-03-08 NOTE — Transfer of Care (Signed)
Immediate Anesthesia Transfer of Care Note  Patient: NIC LAMPE  Procedure(s) Performed: Procedure(s) with comments: CATARACT EXTRACTION PHACO AND INTRAOCULAR LENS PLACEMENT (IOC) (Right) - [place] 02.0 AP% 11.4 CDE 0.57 Fluid pack lot # [id number]  Patient Location: PACU  Anesthesia Type:MAC  Level of Consciousness: awake, alert  and oriented  Airway & Oxygen Therapy: Patient Spontanous Breathing  Post-op Assessment: Report given to RN and Post -op Vital signs reviewed and stable  Post vital signs: Reviewed and stable  Last Vitals:  Vitals:   03/08/16 0606 03/08/16 0908  BP: (!) 141/60 126/76  Pulse: 72 69  Resp: 18 12  Temp: 36.6 C     Last Pain:  Vitals:   03/08/16 0606  TempSrc: Tympanic         Complications: No apparent anesthesia complications

## 2016-03-08 NOTE — Anesthesia Procedure Notes (Signed)
Procedure Name: MAC Date/Time: 03/08/2016 8:02 AM Performed by: Hedda Slade Pre-anesthesia Checklist: Patient identified, Emergency Drugs available, Suction available and Patient being monitored Oxygen Delivery Method: Nasal cannula

## 2016-03-08 NOTE — Anesthesia Postprocedure Evaluation (Signed)
Anesthesia Post Note  Patient: Colton Gill  Procedure(s) Performed: Procedure(s) (LRB): CATARACT EXTRACTION PHACO AND INTRAOCULAR LENS PLACEMENT (IOC) (Right)  Patient location during evaluation: PACU Anesthesia Type: MAC Level of consciousness: awake, awake and alert and oriented Pain management: pain level controlled Vital Signs Assessment: post-procedure vital signs reviewed and stable Respiratory status: spontaneous breathing Cardiovascular status: blood pressure returned to baseline Postop Assessment: no signs of nausea or vomiting Anesthetic complications: no     Last Vitals:  Vitals:   03/08/16 0606 03/08/16 0908  BP: (!) 141/60 126/76  Pulse: 72 69  Resp: 18 12  Temp: 36.6 C     Last Pain:  Vitals:   03/08/16 0606  TempSrc: Tympanic                 Armandina Iman Lorenza Chick

## 2016-03-08 NOTE — Interval H&P Note (Signed)
History and Physical Interval Note:  03/08/2016 7:40 AM  Colton Gill  has presented today for surgery, with the diagnosis of Nuclear Sclerotic Cataract Right Eye  The various methods of treatment have been discussed with the patient and family. After consideration of risks, benefits and other options for treatment, the patient has consented to  Procedure(s) with comments: CATARACT EXTRACTION PHACO AND INTRAOCULAR LENS PLACEMENT (IOC) (Right) - [place] AP% CDE Fluid pack lot # [id number] as a surgical intervention .  The patient's history has been reviewed, patient examined, no change in status, stable for surgery.  I have reviewed the patient's chart and labs.  Questions were answered to the patient's satisfaction.     Shundra Wirsing

## 2020-01-23 LAB — COLOGUARD

## 2020-06-02 ENCOUNTER — Other Ambulatory Visit: Payer: Self-pay | Admitting: Family Medicine

## 2020-06-02 DIAGNOSIS — M5416 Radiculopathy, lumbar region: Secondary | ICD-10-CM

## 2020-06-14 ENCOUNTER — Ambulatory Visit
Admission: RE | Admit: 2020-06-14 | Discharge: 2020-06-14 | Disposition: A | Discharge status: Home / Self Care | Payer: Medicare Other | Source: Ambulatory Visit | Attending: Family Medicine | Admitting: Family Medicine

## 2020-06-14 ENCOUNTER — Other Ambulatory Visit: Payer: Self-pay

## 2020-06-14 DIAGNOSIS — M5416 Radiculopathy, lumbar region: Secondary | ICD-10-CM | POA: Diagnosis present

## 2021-08-17 ENCOUNTER — Encounter: Payer: Self-pay | Admitting: Urology

## 2021-08-17 ENCOUNTER — Ambulatory Visit (INDEPENDENT_AMBULATORY_CARE_PROVIDER_SITE_OTHER): Payer: Medicare Other | Admitting: Urology

## 2021-08-17 VITALS — BP 162/66 | HR 78 | Ht 64.0 in | Wt 177.0 lb

## 2021-08-17 DIAGNOSIS — R338 Other retention of urine: Secondary | ICD-10-CM

## 2021-08-17 DIAGNOSIS — N401 Enlarged prostate with lower urinary tract symptoms: Secondary | ICD-10-CM

## 2021-08-17 LAB — BLADDER SCAN AMB NON-IMAGING: Scan Result: 125

## 2021-08-17 NOTE — Progress Notes (Signed)
Catheter Removal  Patient is present today for a catheter removal.  62m of water was drained from the balloon. A 16FR foley cath was removed from the bladder no complications were noted . Patient tolerated well.  Performed by: JGaspar ColaCMA  Follow up/ Additional notes: this afternoon

## 2021-08-17 NOTE — Progress Notes (Signed)
08/17/21 4:59 PM   Colton Gill 04/26/1940 [id number]  Referring provider:  Adin Hector, MD [place],  [place] [address]  Chief Complaint  Patient presents with   Urinary Retention    Urological history  Urinary retention - He was seen in the ED on 07/27/2021 for urinary retention  -Underwent a voiding trial with Dr Bernardo Heater today    HPI: Colton Gill is a 81 y.o.male who presents today fo further evaluation of urinary retention.   He feels like he is emptying well today.   Patient denies any modifying or aggravating factors.  Patient denies any gross hematuria, dysuria or suprapubic/flank pain. Patient denies any fevers, chills, nausea or vomiting.   PVR 125 mL    PMH: Past Medical History:  Diagnosis Date   Chronic kidney disease    Coronary artery disease    Diabetes mellitus without complication ([place])    Erectile dysfunction    Hyperlipidemia    Vertigo     Surgical History: Past Surgical History:  Procedure Laterality Date   CATARACT EXTRACTION W/PHACO Right 03/08/2016   Procedure: CATARACT EXTRACTION PHACO AND INTRAOCULAR LENS PLACEMENT ([place]);  Surgeon: Estill Cotta, MD;  Location: [facility] ORS;  Service: Ophthalmology;  Laterality: Right;  [place] 02.0 AP% 11.4 CDE 0.57 Fluid pack lot # [id number] H   COLONOSCOPY     COLONOSCOPY WITH PROPOFOL N/A 11/10/2014   Procedure: COLONOSCOPY WITH PROPOFOL;  Surgeon: Lollie Sails, MD;  Location: [facility] ENDOSCOPY;  Service: Endoscopy;  Laterality: N/A;   CORONARY ARTERY BYPASS GRAFT     EYE SURGERY      Home Medications:  Allergies as of 08/17/2021   No Known Allergies      Medication List        Accurate as of August 17, 2021  4:59 PM. If you have any questions, ask your nurse or doctor.          aspirin 81 MG tablet Take 81 mg by mouth daily.   Fenofibric Acid 135 MG Cpdr Take 135 mg by mouth daily.   FISH OIL PO Take 1 capsule by mouth daily.    glimepiride 4 MG tablet Commonly known as: AMARYL Take 4 mg by mouth 2 (two) times daily.   LEVEMIR FLEXPEN [place] Inject 34 Units into the skin at bedtime.   metoprolol tartrate 50 MG tablet Commonly known as: LOPRESSOR Take 50 mg by mouth 2 (two) times daily.   multivitamin with minerals Tabs tablet Take 1 tablet by mouth daily.   pioglitazone 30 MG tablet Commonly known as: ACTOS Take 30 mg by mouth daily.   ramipril 2.5 MG capsule Commonly known as: ALTACE Take 2.5 mg by mouth daily.   simvastatin 40 MG tablet Commonly known as: ZOCOR Take 40 mg by mouth daily at 6 PM.   vitamin B-12 1000 MCG tablet Commonly known as: CYANOCOBALAMIN Take 1,000 mcg by mouth daily.   vitamin C 1000 MG tablet Take 1,000 mg by mouth daily.        Allergies:  No Known Allergies  Family History: No family history on file.  Social History:  reports that he has never smoked. He has never used smokeless tobacco. He reports current alcohol use. He reports that he does not use drugs.   Physical Exam: There were no vitals taken for this visit.  Constitutional:  Alert and oriented, No acute distress. HEENT: [place] AT, moist mucus membranes.  Trachea midline, no masses. Cardiovascular:  No clubbing, cyanosis, or edema. Respiratory: Normal respiratory effort, no increased work of breathing. GI: Abdomen is soft, nontender, nondistended, no abdominal masses GU: No CVA tenderness Lymph: No cervical or inguinal lymphadenopathy. Skin: No rashes, bruises or suspicious lesions. Neurologic: Grossly intact, no focal deficits, moving all 4 extremities. Psychiatric: Normal mood and affect.  Laboratory Data: Lab Results  Component Value Date   CREATININE 1.99 (H) 03/13/2014   Lab Results  Component Value Date   HGBA1C (H) 12/19/2006    8.9 (NOTE)   The ADA recommends the following therapeutic goals for glycemic   control related to Hgb A1C measurement:   Goal of Therapy:   < 7.0% Hgb A1C    Action Suggested:  > 8.0% Hgb A1C   Ref:  Diabetes Care, 22, Suppl. 1, 1999    Ref Range & Units 3 wk ago  WBC (White Blood Cell Count) 4.1 - 10.2 10^3/uL 7.6   RBC (Red Blood Cell Count) 4.69 - 6.13 10^6/uL 4.29 Low    Hemoglobin 14.1 - 18.1 gm/dL 12.4 Low    Hematocrit 40.0 - 52.0 % 38.4 Low    MCV (Mean Corpuscular Volume) 80.0 - 100.0 fl 89.5   MCH (Mean Corpuscular Hemoglobin) 27.0 - 31.2 pg 28.9   MCHC (Mean Corpuscular Hemoglobin Concentration) 32.0 - 36.0 gm/dL 32.3   Platelet Count 150 - 450 10^3/uL 187   RDW-CV (Red Cell Distribution Width) 11.6 - 14.8 % 14.2   MPV (Mean Platelet Volume) 9.4 - 12.4 fl 10.4   Neutrophils 1.50 - 7.80 10^3/uL 5.57   Lymphocytes 1.00 - 3.60 10^3/uL 0.76 Low    Monocytes 0.00 - 1.50 10^3/uL 1.12   Eosinophils 0.00 - 0.55 10^3/uL 0.06   Basophils 0.00 - 0.09 10^3/uL 0.03   Neutrophil % 32.0 - 70.0 % 73.6 High    Lymphocyte % 10.0 - 50.0 % 10.0   Monocyte % 4.0 - 13.0 % 14.8 High    Eosinophil % 1.0 - 5.0 % 0.8 Low    Basophil% 0.0 - 2.0 % 0.4   Immature Granulocyte % <=0.7 % 0.4   Immature Granulocyte Count <=0.06 10^3/L 0.03   Resulting Agency  [facility]    Ref Range & Units 3 wk ago  Glucose 70 - 110 mg/dL 93   Sodium 136 - 145 mmol/L 139   Potassium 3.6 - 5.1 mmol/L 4.2   Chloride 97 - 109 mmol/L 105   Carbon Dioxide (CO2) 22.0 - 32.0 mmol/L 26.0   Urea Nitrogen (BUN) 7 - 25 mg/dL 30 High    Creatinine 0.7 - 1.3 mg/dL 1.9 High    Glomerular Filtration Rate (eGFR), MDRD Estimate >60 mL/min/1.73sq m 34 Low    Calcium 8.7 - 10.3 mg/dL 9.7   AST  8 - 39 U/L 24   ALT  6 - 57 U/L 20   Alk Phos (alkaline Phosphatase) 34 - 104 U/L 32 Low    Albumin 3.5 - 4.8 g/dL 4.2   Bilirubin, Total 0.3 - 1.2 mg/dL 0.6   Protein, Total 6.1 - 7.9 g/dL 6.7   A/G Ratio 1.0 - 5.0 gm/dL 1.7   Resulting Agency  [facility]     Pertinent Imaging: Results for orders placed or performed in visit on 08/17/21  Bladder Scan  (Post Void Residual) in office  Result Value Ref Range   Scan Result 125      Assessment & Plan:    BPH with urinary retention  -Most  likely precipitated by BPH and constipation  - catheter removed this morning; repeat  PVR  noted adequate emptying    Return in 1 month for IPSS, PVR, and [facility] [address] [place],  [address] [phone]  I, Colton Gill,acting as a scribe for [facility], PA-C.,have documented all relevant documentation on the behalf of Colton Vanderford, PA-C,as directed by  [facility], PA-C while in the presence of [place], PA-C.  I have reviewed the above documentation for accuracy and completeness, and I agree with the above.    Colton Council, PA-C

## 2021-08-17 NOTE — Progress Notes (Signed)
08/17/2021 9:00 AM   [name] [birth year] [id number]  Referring provider: Adin Hector, MD [facility] [place],  [place] [address]  Chief Complaint  Patient presents with   Other    HPI: Colton Gill is a 81 y.o. male who presents in follow-up of a recent ED visit for urinary retention  ED visit [facility] 08/06/2021 for acute urinary retention.  Complaining of significant constipation several days prior to onset of his retention Denied use of OTC decongestants No previous history of lower urinary tract symptoms or urinary retention Foley catheter was placed with 900 mL of urine obtained.  He was given Keflex x3 days and 10 days of tamsulosin.  He took his last dose of tamsulosin today Presents for catheter removal/voiding trial   PMH: Past Medical History:  Diagnosis Date   Chronic kidney disease    Coronary artery disease    Diabetes mellitus without complication ([place])    Erectile dysfunction    Hyperlipidemia    Vertigo     Surgical History: Past Surgical History:  Procedure Laterality Date   CATARACT EXTRACTION W/PHACO Right 03/08/2016   Procedure: CATARACT EXTRACTION PHACO AND INTRAOCULAR LENS PLACEMENT ([place]);  Surgeon: Estill Cotta, MD;  Location: [facility] ORS;  Service: Ophthalmology;  Laterality: Right;  [place] 02.0 AP% 11.4 CDE 0.57 Fluid pack lot # [id number]   COLONOSCOPY     COLONOSCOPY WITH PROPOFOL N/A 11/10/2014   Procedure: COLONOSCOPY WITH PROPOFOL;  Surgeon: Lollie Sails, MD;  Location: [facility] ENDOSCOPY;  Service: Endoscopy;  Laterality: N/A;   CORONARY ARTERY BYPASS GRAFT     EYE SURGERY      Home Medications:  Allergies as of 08/17/2021   No Known Allergies      Medication List        Accurate as of August 17, 2021  9:00 AM. If you have any questions, ask your nurse or doctor.          aspirin 81 MG tablet Take 81 mg by mouth daily.   Fenofibric Acid 135 MG Cpdr Take 135 mg by  mouth daily.   FISH OIL PO Take 1 capsule by mouth daily.   glimepiride 4 MG tablet Commonly known as: AMARYL Take 4 mg by mouth 2 (two) times daily.   LEVEMIR FLEXPEN [place] Inject 34 Units into the skin at bedtime.   metoprolol tartrate 50 MG tablet Commonly known as: LOPRESSOR Take 50 mg by mouth 2 (two) times daily.   multivitamin with minerals Tabs tablet Take 1 tablet by mouth daily.   pioglitazone 30 MG tablet Commonly known as: ACTOS Take 30 mg by mouth daily.   ramipril 2.5 MG capsule Commonly known as: ALTACE Take 2.5 mg by mouth daily.   simvastatin 40 MG tablet Commonly known as: ZOCOR Take 40 mg by mouth daily at 6 PM.   vitamin B-12 1000 MCG tablet Commonly known as: CYANOCOBALAMIN Take 1,000 mcg by mouth daily.   vitamin C 1000 MG tablet Take 1,000 mg by mouth daily.        Allergies: No Known Allergies  Family History: No family history on file.  Social History:  reports that he has never smoked. He has never used smokeless tobacco. He reports current alcohol use. He reports that he does not use drugs.   Physical Exam: BP (!) 162/66   Pulse 78   Ht '5\' 4"'$  (1.626 m)   Wt 177 lb (80.3 kg)  BMI 30.38 kg/m   Constitutional:  Alert and oriented, No acute distress. HEENT: [place] AT Respiratory: Normal respiratory effort, no increased work of breathing. GU: Foley catheter draining clear urine Skin: No rashes, bruises or suspicious lesions. Neurologic: Grossly intact, no focal deficits, moving all 4 extremities. Psychiatric: Normal mood and affect.   Assessment & Plan:    1.  Acute urinary retention Most likely underlying BPH precipitated by constipation Catheter removed and he has follow-up bladder scan scheduled this afternoon If voiding trial successful recommend a 1 month follow-up for bladder scan and DRE He may not need long-term tamsulosin but could take prophylactically   Abbie Sons, MD  [place] [address] [place], [place] [address] [phone]

## 2021-09-15 ENCOUNTER — Encounter: Payer: Self-pay | Admitting: Urology

## 2021-09-15 ENCOUNTER — Ambulatory Visit (INDEPENDENT_AMBULATORY_CARE_PROVIDER_SITE_OTHER): Payer: Medicare Other | Admitting: Urology

## 2021-09-15 VITALS — BP 139/63 | HR 78 | Ht 64.0 in | Wt 178.0 lb

## 2021-09-15 DIAGNOSIS — R338 Other retention of urine: Secondary | ICD-10-CM | POA: Diagnosis not present

## 2021-09-15 DIAGNOSIS — N401 Enlarged prostate with lower urinary tract symptoms: Secondary | ICD-10-CM

## 2021-09-15 LAB — BLADDER SCAN AMB NON-IMAGING

## 2021-09-15 NOTE — Progress Notes (Signed)
09/15/21 10:10 AM   [name] 1940/03/26 [id number]  Referring provider:  Adin Hector, MD [place] [facility] [place],  [place] [address]  Chief Complaint  Patient presents with   Benign Prostatic Hypertrophy    Urological history  BPH with Urinary retention - He was seen in the ED on 07/27/2021 for urinary retention  - Underwent a successful voiding trial with Dr Bernardo Heater on 08/17/2021. - IPSS 5/0 - PVR 0 mL   HPI: Colton Gill is a 81 y.o.male who presents today for a 1 month follow-up with IPSS and PVR.   He reports that he does not take any BPH medication. He does not take Flomax. He has constipation which he attributes to his previous urinary symptoms. He is now on stool softeners.  Patient denies any modifying or aggravating factors.  Patient denies any gross hematuria, dysuria or suprapubic/flank pain.  Patient denies any fevers, chills, nausea or vomiting.      IPSS     Row Name 09/15/21 0900         International Prostate Symptom Score   How often have you had the sensation of not emptying your bladder? Not at All     How often have you had to urinate less than every two hours? Not at All     How often have you found you stopped and started again several times when you urinated? Not at All     How often have you found it difficult to postpone urination? Not at All     How often have you had a weak urinary stream? Not at All     How often have you had to strain to start urination? Not at All     How many times did you typically get up at night to urinate? 5 Times     Total IPSS Score 5       Quality of Life due to urinary symptoms   If you were to spend the rest of your life with your urinary condition just the way it is now how would you feel about that? Delighted              Score:  1-7 Mild 8-19 Moderate 20-35 Severe   PMH: Past Medical History:  Diagnosis Date   Chronic kidney disease    Coronary artery  disease    Diabetes mellitus without complication ([place])    Erectile dysfunction    Hyperlipidemia    Vertigo     Surgical History: Past Surgical History:  Procedure Laterality Date   CATARACT EXTRACTION W/PHACO Right 03/08/2016   Procedure: CATARACT EXTRACTION PHACO AND INTRAOCULAR LENS PLACEMENT ([place]);  Surgeon: Estill Cotta, MD;  Location: [facility] ORS;  Service: Ophthalmology;  Laterality: Right;  [place] 02.0 AP% 11.4 CDE 0.57 Fluid pack lot # [id number]   COLONOSCOPY     COLONOSCOPY WITH PROPOFOL N/A 11/10/2014   Procedure: COLONOSCOPY WITH PROPOFOL;  Surgeon: Lollie Sails, MD;  [facility] ENDOSCOPY;  Service: Endoscopy;  Laterality: N/A;   CORONARY ARTERY BYPASS GRAFT     EYE SURGERY      Home Medications:  Allergies as of 09/15/2021   No Known Allergies      Medication List        Accurate as of September 15, 2021 10:10 AM. If you have any questions, ask your nurse or doctor.          aspirin 81 MG tablet Take 81 mg  by mouth daily.   cyanocobalamin 1000 MCG tablet Commonly known as: VITAMIN B12 Take 1,000 mcg by mouth daily.   Fenofibric Acid 135 MG Cpdr Take 135 mg by mouth daily.   FISH OIL PO Take 1 capsule by mouth daily.   glimepiride 4 MG tablet Commonly known as: AMARYL Take 4 mg by mouth 2 (two) times daily.   LEVEMIR FLEXPEN [place] Inject 34 Units into the skin at bedtime.   metoprolol tartrate 50 MG tablet Commonly known as: LOPRESSOR Take 50 mg by mouth 2 (two) times daily.   multivitamin with minerals Tabs tablet Take 1 tablet by mouth daily.   pioglitazone 30 MG tablet Commonly known as: ACTOS Take 30 mg by mouth daily.   ramipril 2.5 MG capsule Commonly known as: ALTACE Take 2.5 mg by mouth daily.   simvastatin 40 MG tablet Commonly known as: ZOCOR Take 40 mg by mouth daily at 6 PM.   vitamin C 1000 MG tablet Take 1,000 mg by mouth daily.        Allergies:  No Known Allergies  Family History: No family history on  file.  Social History:  reports that he has never smoked. He has never used smokeless tobacco. He reports current alcohol use. He reports that he does not use drugs.   Physical Exam: BP 139/63   Pulse 78   Ht '5\' 4"'$  (1.626 m)   Wt 178 lb (80.7 kg)   BMI 30.55 kg/m   Constitutional:  Alert and oriented, No acute distress. HEENT: [place] AT, moist mucus membranes.  Trachea midline, no masses. Cardiovascular: No clubbing, cyanosis, or edema. Respiratory: Normal respiratory effort, no increased work of breathing. Neurologic: Grossly intact, no focal deficits, moving all 4 extremities. Psychiatric: Normal mood and affect.  Laboratory Data: Urinalysis 1+ glucose, 2+ protein 11-30 WBCs, trace leukocytes, and few bacteria. I have reviewed the labs.   Pertinent imaging:  Results for orders placed or performed in visit on 09/15/21  Bladder Scan (Post Void Residual) in office  Result Value Ref Range   Scan Result 93m      Assessment & Plan:    BPH with urinary retention  - resolved  - Most likely precipitated by BPH and constipation  - PVR 0 ml - Continue taking stool softeners for a bowel movement a day - deferred DRE stating PCP performs yearly   Return if symptoms worsen or fail to improve.  [address] [place] [phone] I, KKirke ShaggyLittlejohn,acting as a scribe for [facility] PA-C.,have documented all relevant documentation on the behalf of [name], PA-C,as directed by  [facility] PA-C while in the presence of [place] PA-C.  I have reviewed the above documentation for accuracy and completeness, and I agree with the above.    SZara Council PA-C

## 2021-09-16 LAB — MICROSCOPIC EXAMINATION

## 2021-09-16 LAB — URINALYSIS, COMPLETE
Bilirubin, UA: NEGATIVE
Ketones, UA: NEGATIVE
Nitrite, UA: NEGATIVE
RBC, UA: NEGATIVE
Specific Gravity, UA: 1.025 (ref 1.005–1.030)
Urobilinogen, Ur: 0.2 mg/dL (ref 0.2–1.0)
pH, UA: 5 (ref 5.0–7.5)

## 2021-11-21 ENCOUNTER — Emergency Department: Payer: Medicare Other

## 2021-11-21 ENCOUNTER — Other Ambulatory Visit: Payer: Self-pay

## 2021-11-21 ENCOUNTER — Emergency Department
Admission: EM | Admit: 2021-11-21 | Discharge: 2021-11-22 | Disposition: A | Discharge status: Home / Self Care | Payer: Medicare Other | Attending: Emergency Medicine | Admitting: Emergency Medicine

## 2021-11-21 ENCOUNTER — Encounter: Payer: Self-pay | Admitting: Emergency Medicine

## 2021-11-21 DIAGNOSIS — R0789 Other chest pain: Secondary | ICD-10-CM | POA: Diagnosis not present

## 2021-11-21 DIAGNOSIS — Z951 Presence of aortocoronary bypass graft: Secondary | ICD-10-CM | POA: Insufficient documentation

## 2021-11-21 DIAGNOSIS — N189 Chronic kidney disease, unspecified: Secondary | ICD-10-CM | POA: Insufficient documentation

## 2021-11-21 DIAGNOSIS — I2510 Atherosclerotic heart disease of native coronary artery without angina pectoris: Secondary | ICD-10-CM | POA: Insufficient documentation

## 2021-11-21 DIAGNOSIS — E1122 Type 2 diabetes mellitus with diabetic chronic kidney disease: Secondary | ICD-10-CM | POA: Insufficient documentation

## 2021-11-21 LAB — BASIC METABOLIC PANEL
Anion gap: 8 (ref 5–15)
BUN: 31 mg/dL — ABNORMAL HIGH (ref 8–23)
CO2: 26 mmol/L (ref 22–32)
Calcium: 9.4 mg/dL (ref 8.9–10.3)
Chloride: 106 mmol/L (ref 98–111)
Creatinine, Ser: 1.68 mg/dL — ABNORMAL HIGH (ref 0.61–1.24)
GFR, Estimated: 41 mL/min — ABNORMAL LOW (ref >60)
Glucose, Bld: 134 mg/dL — ABNORMAL HIGH (ref 70–99)
Potassium: 4 mmol/L (ref 3.5–5.1)
Sodium: 140 mmol/L (ref 135–145)

## 2021-11-21 LAB — CBC
HCT: 38 % — ABNORMAL LOW (ref 39.0–52.0)
Hemoglobin: 12 g/dL — ABNORMAL LOW (ref 13.0–17.0)
MCH: 27.7 pg (ref 26.0–34.0)
MCHC: 31.6 g/dL (ref 30.0–36.0)
MCV: 87.8 fL (ref 80.0–100.0)
Platelets: 248 10*3/uL (ref 150–400)
RBC: 4.33 MIL/uL (ref 4.22–5.81)
RDW: 14.9 % (ref 11.5–15.5)
WBC: 7.9 10*3/uL (ref 4.0–10.5)
nRBC: 0 % (ref 0.0–0.2)

## 2021-11-21 LAB — TROPONIN I (HIGH SENSITIVITY)
Troponin I (High Sensitivity): 9 ng/L (ref <18)
Troponin I (High Sensitivity): 8 ng/L (ref <18)

## 2021-11-21 MED ORDER — CYCLOBENZAPRINE HCL 10 MG PO TABS
10.0000 mg | ORAL_TABLET | Freq: Once | ORAL | Status: AC
  Administered 2021-11-21: 10 mg via ORAL
  Filled 2021-11-21: qty 1

## 2021-11-21 NOTE — ED Provider Notes (Signed)
[facility] Provider Note    Event Date/Time   First MD Initiated Contact with Patient 11/21/21 2255     (approximate)   History   Chest Pain   HPI  Colton Gill is a 81 y.o. 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  It is sharp and radiates to the left arm.  It is worse with certain positions or with trying to reach with his arms.  However, it is not associated with exertion and actually walking seems to improve it.  He denies associated shortness of breath or lightheadedness.  The pain has now mostly subsided.  I reviewed the past medical records.  The patient follows with Dr. Ubaldo Glassing from cardiology and was most recently seen in May.  He has a remote history of a CABG and stents previous to this although has not had an MI or acute cardiac issues in some time.  Physical Exam   Triage Vital Signs: ED Triage Vitals  Enc Vitals Group     BP 11/21/21 1955 (!) 151/94     Pulse Rate 11/21/21 1955 78     Resp 11/21/21 1955 18     Temp 11/21/21 1955 97.6 F (36.4 C)     Temp Source 11/21/21 1955 Oral     SpO2 11/21/21 1955 100 %     Weight 11/21/21 1952 182 lb (82.6 kg)     Height 11/21/21 1952 5' (1.524 m)     Head Circumference --      Peak Flow --      Pain Score 11/21/21 1952 0     Pain Loc --      Pain Edu? --      Excl. in [place]? --     Most recent vital signs: Vitals:   11/22/21 0000 11/22/21 0030  BP: (!) 156/62 (!) 143/62  Pulse: 76 76  Resp: 18 (!) 24  Temp:    SpO2: 99% 95%     General: Awake, no distress.  CV:  Good peripheral perfusion.  No chest wall tenderness.  Normal heart sounds. Resp:  Normal effort.  Lungs CTAB. Abd:  No distention.  Other:  No peripheral edema.   ED Results / Procedures / Treatments   Labs (all labs ordered are  listed, but only abnormal results are displayed) Labs Reviewed  BASIC METABOLIC PANEL - Abnormal; Notable for the following components:      Result Value   Glucose, Bld 134 (*)    BUN 31 (*)    Creatinine, Ser 1.68 (*)    GFR, Estimated 41 (*)    All other components within normal limits  CBC - Abnormal; Notable for the following components:   Hemoglobin 12.0 (*)    HCT 38.0 (*)    All other components within normal limits  TROPONIN I (HIGH SENSITIVITY)  TROPONIN I (HIGH SENSITIVITY)     EKG  ED ECG REPORT I, Arta Silence, the attending physician, personally viewed and interpreted this ECG.  Date: 11/21/2021 EKG Time: 1957 Rate: 79 Rhythm: normal sinus rhythm QRS Axis: Right axis Intervals: normal ST/T Wave abnormalities: Nonspecific T wave abnormalities Narrative Interpretation: Nonspecific abnormalities with no evidence of acute ischemia; interpretation limited due to poor EKG baseline   ED ECG REPORT I, Arta Silence, the attending physician, personally viewed and  interpreted this ECG.  Date: 11/21/2021 EKG Time: 2356 Rate: 77 Rhythm: normal sinus rhythm QRS Axis: Right axis Intervals: normal ST/T Wave abnormalities: normal Narrative Interpretation: no evidence of acute ischemia; no significant change when compared to EKG of 03/13/2014     RADIOLOGY  Chest x-ray: I independently viewed and interpreted the images; there is no focal consolidation or edema  PROCEDURES:  Critical Care performed: No  Procedures   MEDICATIONS ORDERED IN ED: Medications  traMADol (ULTRAM) tablet 50 mg (has no administration in time range)  cyclobenzaprine (FLEXERIL) tablet 10 mg (10 mg Oral Given 11/21/21 2358)     IMPRESSION / MDM / ASSESSMENT AND PLAN / ED COURSE  I reviewed the triage vital signs and the nursing notes.  81 year old male with PMH as noted above including prior CAD history presents with very atypical chest pain which is reproducible with  certain movements and positions and now mostly subsided.  On exam the patient is well-appearing and the pain is reproducible when the patient reaches forward to try to sit up in the bed, and I see visible spasms in his left arm and chest wall.  There are no other significant exam findings.  EKG is nonischemic.  I ordered a repeat due to poor baseline on the initial EKG and there are no dynamic changes.  Chest x-ray shows no acute abnormalities.  Initial and repeat troponins are both negative.  Creatinine is elevated and at baseline.  Differential diagnosis includes, but is not limited to, musculoskeletal pain including muscle strain/spasm, radiculopathy, less likely ACS.  There is no evidence of aortic dissection or other vascular cause.  There is no clinical evidence for PE.  Given the negative troponins and mostly resolved symptoms, I expect that the patient will be appropriate for discharge home with close follow-up.  We will give a dose of Flexeril here and reassess.  Patient's presentation is most consistent with acute presentation with potential threat to life or bodily function.  The patient is on the cardiac monitor to evaluate for evidence of arrhythmia and/or significant heart rate changes.  ----------------------------------------- 1:04 AM on 11/22/2021 -----------------------------------------  The patient was able to sleep and reports improvement in the pain although it is still there somewhat.  However, now the pain is mostly in the back and is completely reproducible.  If the patient is still he does not have pain, but if his back is palpated or if he turns in bed he then has the pain again.  This is consistent with muscular etiology.  Given the patient's CAD history and elevated risk of ACS I did consider whether he may benefit from admission for further monitoring.  However given the nonischemic EKG, negative troponins x2, and highly reproducible pain, there is no evidence of  cardiac cause of the patient's presentation today.  He feels well and strongly prefers to go home.  I recommended that he follow-up with his cardiologist this week and will send a secure chat message to Dr. Ubaldo Glassing about the patient's ED visit today.  I gave the patient strict return precautions and he expressed understanding.   FINAL CLINICAL IMPRESSION(S) / ED DIAGNOSES   Final diagnoses:  Atypical chest pain     Rx / DC Orders   ED Discharge Orders     None        Note:  This document was prepared using Dragon voice recognition software and may include unintentional dictation errors.    Arta Silence, MD 11/22/21 [phone]

## 2021-11-21 NOTE — ED Triage Notes (Signed)
Pt presents via POV with complaints of left side CP with associated left arm pain. Pt was seen by paramedics who obtained an EKG and told him it was "normal" but recommended that he follow up in the ED. He states that he has no pain when he's sitting still. Hx of bypass sx.  Denies SOB.

## 2021-11-22 DIAGNOSIS — R0789 Other chest pain: Secondary | ICD-10-CM | POA: Diagnosis not present

## 2021-11-22 MED ORDER — TRAMADOL HCL 50 MG PO TABS
50.0000 mg | ORAL_TABLET | Freq: Once | ORAL | Status: AC
  Administered 2021-11-22: 50 mg via ORAL
  Filled 2021-11-22: qty 1

## 2021-11-22 NOTE — Discharge Instructions (Addendum)
Return to the ER for new, worsening, or persistent severe chest or back pain, difficulty breathing, weakness or lightheadedness, or any other new or worsening symptoms that concern you.  Call Dr. Bethanne Ginger office tomorrow to arrange for follow-up within the next several days.

## 2022-05-02 ENCOUNTER — Emergency Department: Payer: Medicare Other

## 2022-05-02 ENCOUNTER — Emergency Department
Admission: EM | Admit: 2022-05-02 | Discharge: 2022-05-02 | Disposition: A | Discharge status: Home / Self Care | Payer: Medicare Other | Attending: Emergency Medicine | Admitting: Emergency Medicine

## 2022-05-02 DIAGNOSIS — R1032 Left lower quadrant pain: Secondary | ICD-10-CM | POA: Diagnosis present

## 2022-05-02 DIAGNOSIS — N132 Hydronephrosis with renal and ureteral calculous obstruction: Secondary | ICD-10-CM | POA: Insufficient documentation

## 2022-05-02 DIAGNOSIS — N201 Calculus of ureter: Secondary | ICD-10-CM

## 2022-05-02 DIAGNOSIS — R109 Unspecified abdominal pain: Secondary | ICD-10-CM

## 2022-05-02 DIAGNOSIS — N200 Calculus of kidney: Secondary | ICD-10-CM

## 2022-05-02 LAB — CBC
HCT: 39.6 % (ref 39.0–52.0)
Hemoglobin: 12.6 g/dL — ABNORMAL LOW (ref 13.0–17.0)
MCH: 28.6 pg (ref 26.0–34.0)
MCHC: 31.8 g/dL (ref 30.0–36.0)
MCV: 89.8 fL (ref 80.0–100.0)
Platelets: 224 10*3/uL (ref 150–400)
RBC: 4.41 MIL/uL (ref 4.22–5.81)
RDW: 14.2 % (ref 11.5–15.5)
WBC: 6.2 10*3/uL (ref 4.0–10.5)
nRBC: 0 % (ref 0.0–0.2)

## 2022-05-02 LAB — BASIC METABOLIC PANEL
Anion gap: 11 (ref 5–15)
BUN: 35 mg/dL — ABNORMAL HIGH (ref 8–23)
CO2: 24 mmol/L (ref 22–32)
Calcium: 9.7 mg/dL (ref 8.9–10.3)
Chloride: 105 mmol/L (ref 98–111)
Creatinine, Ser: 2.02 mg/dL — ABNORMAL HIGH (ref 0.61–1.24)
GFR, Estimated: 33 mL/min — ABNORMAL LOW (ref >60)
Glucose, Bld: 205 mg/dL — ABNORMAL HIGH (ref 70–99)
Potassium: 4.3 mmol/L (ref 3.5–5.1)
Sodium: 140 mmol/L (ref 135–145)

## 2022-05-02 MED ORDER — TAMSULOSIN HCL 0.4 MG PO CAPS: 0.4000 mg | ORAL_CAPSULE | Freq: Every day | ORAL | 0 refills | Status: AC

## 2022-05-02 MED ORDER — ONDANSETRON HCL 4 MG/2ML IJ SOLN
4.0000 mg | Freq: Once | INTRAMUSCULAR | Status: AC
  Administered 2022-05-02: 4 mg via INTRAVENOUS
  Filled 2022-05-02: qty 2

## 2022-05-02 MED ORDER — KETOROLAC TROMETHAMINE 10 MG PO TABS: 10.0000 mg | ORAL_TABLET | Freq: Four times a day (QID) | ORAL | 0 refills | Status: DC | PRN

## 2022-05-02 MED ORDER — SODIUM CHLORIDE 0.9 % IV BOLUS
1000.0000 mL | Freq: Once | INTRAVENOUS | Status: AC
  Administered 2022-05-02: 1000 mL via INTRAVENOUS

## 2022-05-02 MED ORDER — HYDROCODONE-ACETAMINOPHEN 5-325 MG PO TABS: 1.0000 | ORAL_TABLET | ORAL | 0 refills | Status: DC | PRN

## 2022-05-02 MED ORDER — MORPHINE SULFATE (PF) 4 MG/ML IV SOLN
4.0000 mg | Freq: Once | INTRAVENOUS | Status: AC
  Administered 2022-05-02: 4 mg via INTRAVENOUS
  Filled 2022-05-02: qty 1

## 2022-05-02 MED ORDER — ONDANSETRON 8 MG PO TBDP: 8.0000 mg | ORAL_TABLET | Freq: Three times a day (TID) | ORAL | 0 refills | Status: DC | PRN

## 2022-05-02 NOTE — ED Notes (Signed)
First Nurse Note: Pt to ED via [facility]. Per Dr. Caryl Comes, pt came in for flank pain, nausea, and vomiting. Pt is currently in NAD.

## 2022-05-02 NOTE — Discharge Instructions (Addendum)
Please urinate through the given screen over the next few days and assess for any small particles that may be the passage of a kidney stone

## 2022-05-02 NOTE — ED Triage Notes (Signed)
Pt sts that he has a hx of kidney stones. Pt presented to the [facility] urgent care for N/V with flank pain. Pt declines any burning with urination or blood in urine, just pain in the left flank.

## 2022-05-02 NOTE — ED Provider Notes (Signed)
[facility] Provider Note   Event Date/Time   First MD Initiated Contact with Patient 05/02/22 1458     (approximate) History  Flank Pain  HPI Colton Gill is a 82 y.o. male with stated past medical history of kidney stones who presents for left flank pain that is been present over the last 3 days and acutely worsened this morning with associated nausea/vomiting.  Patient states that this pain is 9/10, aching, nonradiating to the left flank that has no exacerbating or relieving factors. ROS: Patient currently denies any vision changes, tinnitus, difficulty speaking, facial droop, sore throat, chest pain, shortness of breath, diarrhea, dysuria, or weakness/numbness/paresthesias in any extremity   Physical Exam  Triage Vital Signs: ED Triage Vitals [05/02/22 1351]  Enc Vitals Group     BP 136/71     Pulse Rate 65     Resp 18     Temp (!) 97.5 F (36.4 C)     Temp Source Oral     SpO2 100 %     Weight      Height      Head Circumference      Peak Flow      Pain Score      Pain Loc      Pain Edu?      Excl. in [place]?    Most recent vital signs: Vitals:   05/02/22 1527 05/02/22 1530  BP: (!) 129/59 (!) 136/52  Pulse: 69 67  Resp: 18   Temp:    SpO2: 100% 100%   General: Awake, oriented x4. CV:  Good peripheral perfusion.  Resp:  Normal effort.  Abd:  No distention.  Other:  Elderly Caucasian overweight male laying in bed in no acute distress ED Results / Procedures / Treatments  Labs (all labs ordered are listed, but only abnormal results are displayed) Labs Reviewed  BASIC METABOLIC PANEL - Abnormal; Notable for the following components:      Result Value   Glucose, Bld 205 (*)    BUN 35 (*)    Creatinine, Ser 2.02 (*)    GFR, Estimated 33 (*)    All other components within normal limits  CBC - Abnormal; Notable for the following components:   Hemoglobin 12.6 (*)    All other components within normal limits  URINALYSIS, ROUTINE W  REFLEX MICROSCOPIC   RADIOLOGY ED MD interpretation: 4 mm proximal left ureteral calculi seen on CT of the abdomen and pelvis without IV contrast interpreted independently by me. -Agree with radiology assessment Official radiology report(s): CT Renal Stone Study  Result Date: 05/02/2022 CLINICAL DATA:  Flank pain EXAM: CT ABDOMEN AND PELVIS WITHOUT CONTRAST TECHNIQUE: Multidetector CT imaging of the abdomen and pelvis was performed following the standard protocol without IV contrast. RADIATION DOSE REDUCTION: This exam was performed according to the departmental dose-optimization program which includes automated exposure control, adjustment of the mA and/or kV according to patient size and/or use of iterative reconstruction technique. COMPARISON:  CT abdomen and pelvis 08/20/2012 FINDINGS: Lower chest: No acute abnormality. Hepatobiliary: No focal liver abnormality is seen. No gallstones, gallbladder wall thickening, or biliary dilatation. Pancreas: Unremarkable. No pancreatic ductal dilatation or surrounding inflammatory changes. Spleen: Normal in size without focal abnormality. Adrenals/Urinary Tract: There is a 4 mm calculus in the proximal left ureter. There is mild left-sided hydronephrosis. There is an additional punctate nonobstructing left renal calculus. The adrenal glands, right kidney are within normal limits. Small right-sided bladder diverticulum is present.  The bladder is distended. Stomach/Bowel: Stomach is within normal limits. Appendix appears normal. No evidence of bowel wall thickening, distention, or inflammatory changes. Vascular/Lymphatic: Aortic atherosclerosis. No enlarged abdominal or pelvic lymph nodes. Reproductive: Prostate gland is enlarged. Other: There are small fat containing inguinal and umbilical hernias. No free fluid. Musculoskeletal: Degenerative changes affect the spine IMPRESSION: 1. 4 mm calculus in the proximal left ureter with mild obstructive uropathy. 2. Additional  punctate nonobstructing left renal calculus. 3. 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 4. Prostatomegaly. 5. Aortic atherosclerosis. Aortic Atherosclerosis (ICD10-I70.0). Electronically Signed   By: Ronney Asters M.D.   On: 05/02/2022 15:31   PROCEDURES: Critical Care performed: No .1-3 Lead EKG Interpretation  Performed by: Naaman Plummer, MD Authorized by: Naaman Plummer, MD     Interpretation: normal     ECG rate:  71   ECG rate assessment: normal     Rhythm: sinus rhythm     Ectopy: none     Conduction: normal    MEDICATIONS ORDERED IN ED: Medications  ondansetron (ZOFRAN) injection 4 mg (4 mg Intravenous Given 05/02/22 1535)  sodium chloride 0.9 % bolus 1,000 mL (1,000 mLs Intravenous New Bag/Given 05/02/22 1535)  morphine (PF) 4 MG/ML injection 4 mg (4 mg Intravenous Given 05/02/22 1535)   IMPRESSION / MDM / ASSESSMENT AND PLAN / ED COURSE  I reviewed the triage vital signs and the nursing notes.                             The patient is on the cardiac monitor to evaluate for evidence of arrhythmia and/or significant heart rate changes. Patient's presentation is most consistent with acute presentation with potential threat to life or bodily function. Patient presents for severe flank pain. Presentation most consistent with Renal Colic from a Non-infected Kidney Stone. Given History and Exam I have lower suspicion for atypical appendicitis, genital torsion, acute cholecystitis, AAA, Aortic Dissection, Serious Bacterial Illness or other emergent intraabdominal pathology.  Workup: CBC, BMP, CT Abd/Pelvis noncontrast, UA, reassess Findings: 4 mm left proximal ureteral lithiasis Reassesment: Patient tolerating PO and pain controlled Disposition:  Discharge. Strict return precautions for infected stone or PO intolerance discussed.  Given follow-up instructions for urology   FINAL CLINICAL IMPRESSION(S) / ED DIAGNOSES   Final diagnoses:  Left flank pain   Kidney stone  Ureterolithiasis   Rx / DC Orders   ED Discharge Orders          Ordered    HYDROcodone-acetaminophen (NORCO) 5-325 MG tablet  Every 4 hours PRN        05/02/22 1706    ketorolac (TORADOL) 10 MG tablet  Every 6 hours PRN       Note to Pharmacy: Patient given an IM/IV loading dose in emergency department   05/02/22 1706    ondansetron (ZOFRAN-ODT) 8 MG disintegrating tablet  Every 8 hours PRN        05/02/22 1706           Note:  This document was prepared using Dragon voice recognition software and may include unintentional dictation errors.   Naaman Plummer, MD 05/02/22 1710

## 2022-05-09 NOTE — Progress Notes (Signed)
05/11/22 2:56 PM   Colton Gill 06-12-1940 [id number]  Referring provider:  Adin Hector, MD [facility] [place],   [address]  Chief Complaint  Patient presents with   Other   Urological history  BPH with Urinary retention - He was seen in the ED on 07/27/2021 for urinary retention  - Underwent a successful voiding trial with Dr Bernardo Heater on 08/17/2021  2.  Nephrolithiasis -Stone composition unknown -Remote history of ureteral stent placement with [facility] to follow   HPI: Colton Gill is a 82 y.o.male who presents today for ED follow up with his wife, Colton Gill.   He presented to the ED on 05/01/2022 for left flank pain x 3 days associated with nausea and vomiting.  CT renal stone study (04/2022) - 4 mm calculus in the proximal left ureter with mild obstructive uropathy.  Additional punctate nonobstructing left renal calculus.  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 Prostatomegaly   Labs in the ED:  WBC count (04/2022) 6.2,  Serum creatinine (04/2022) 2.02   Meds given in the ED: Norco, Toradol, Zofran and Flomax  Prior urological history: Stent placement followed by [facility] by Dr. Jacqlyn Larsen   Current NSAID/anticoagulation:   ASA 81 mg   Today, he passed the stone early this a.m.  He brings it in for our analysis.  He took 2 pain medicines this morning and feels, nauseous, but other than that he is just tired from being up all night urinating.  Patient denies any modifying or aggravating factors.  Patient denies any gross hematuria, dysuria or suprapubic/flank pain.  Patient denies any fevers, chills, nausea or vomiting.      PMH: Past Medical History:  Diagnosis Date   Chronic kidney disease    Coronary artery disease    Diabetes mellitus without complication ([place])    Erectile dysfunction    Hyperlipidemia    Vertigo     Surgical History: Past Surgical History:  Procedure Laterality Date   CATARACT EXTRACTION  W/PHACO Right 03/08/2016   Procedure: CATARACT EXTRACTION PHACO AND INTRAOCULAR LENS PLACEMENT ([place]);  Surgeon: Estill Cotta, MD;  Location: [facility] ORS;  Service: Ophthalmology;  Laterality: Right;  [place] 02.0 AP% 11.4 CDE 0.57 Fluid pack lot # [id number]   COLONOSCOPY     COLONOSCOPY WITH PROPOFOL N/A 11/10/2014   Procedure: COLONOSCOPY WITH PROPOFOL;  Surgeon: Lollie Sails, MD;  Location: [facility] ENDOSCOPY;  Service: Endoscopy;  Laterality: N/A;   CORONARY ARTERY BYPASS GRAFT     EYE SURGERY      Home Medications:  Allergies as of 05/11/2022   No Known Allergies      Medication List        Accurate as of May 11, 2022  2:56 PM. If you have any questions, ask your nurse or doctor.          STOP taking these medications    metoprolol tartrate 50 MG tablet Commonly known as: LOPRESSOR Stopped by: Zara Council, PA-C       TAKE these medications    aspirin 81 MG tablet Take 81 mg by mouth daily.   cyanocobalamin 1000 MCG tablet Commonly known as: VITAMIN B12 Take 1,000 mcg by mouth daily.   empagliflozin 25 MG Tabs tablet Commonly known as: JARDIANCE Take 1 tablet by mouth daily.   Fenofibric Acid 135 MG Cpdr Take 135 mg by mouth daily.   FISH OIL PO Take 1 capsule by mouth daily.   glimepiride 4 MG  tablet Commonly known as: AMARYL Take 4 mg by mouth 2 (two) times daily.   HYDROcodone-acetaminophen 5-325 MG tablet Commonly known as: Norco Take 1 tablet by mouth every 4 (four) hours as needed for moderate pain or severe pain.   ketorolac 10 MG tablet Commonly known as: TORADOL Take 1 tablet (10 mg total) by mouth every 6 (six) hours as needed.   LEVEMIR FLEXPEN [place] Inject 34 Units into the skin at bedtime.   multivitamin with minerals Tabs tablet Take 1 tablet by mouth daily.   ondansetron 8 MG disintegrating tablet Commonly known as: ZOFRAN-ODT Take 1 tablet (8 mg total) by mouth every 8 (eight) hours as needed for nausea or vomiting.    pioglitazone 30 MG tablet Commonly known as: ACTOS Take 30 mg by mouth daily.   ramipril 2.5 MG capsule Commonly known as: ALTACE Take 2.5 mg by mouth daily.   simvastatin 40 MG tablet Commonly known as: ZOCOR Take 40 mg by mouth daily at 6 PM.   vitamin C 1000 MG tablet Take 1,000 mg by mouth daily.        Allergies:  No Known Allergies  Family History: No family history on file.  Social History:  reports that he has never smoked. He has never used smokeless tobacco. He reports current alcohol use. He reports that he does not use drugs.   Physical Exam: BP 106/64   Pulse 69   Ht 5\' 3"  (1.6 m)   Wt 178 lb 4.8 oz (80.9 kg)   BMI 31.58 kg/m   Constitutional:  Well nourished. Alert and oriented, No acute distress. HEENT: [place] AT, moist mucus membranes.  Trachea midline Cardiovascular: No clubbing, cyanosis, or edema. Respiratory: Normal respiratory effort, no increased work of breathing. Neurologic: Grossly intact, no focal deficits, moving all 4 extremities. Psychiatric: Normal mood and affect.   Laboratory Data: N/A  Pertinent imaging:  CLINICAL DATA:  Flank pain   EXAM: CT ABDOMEN AND PELVIS WITHOUT CONTRAST   TECHNIQUE: Multidetector CT imaging of the abdomen and pelvis was performed following the standard protocol without IV contrast.   RADIATION DOSE REDUCTION: This exam was performed according to the departmental dose-optimization program which includes automated exposure control, adjustment of the mA and/or kV according to patient size and/or use of iterative reconstruction technique.   COMPARISON:  CT abdomen and pelvis 08/20/2012   FINDINGS: Lower chest: No acute abnormality.   Hepatobiliary: No focal liver abnormality is seen. No gallstones, gallbladder wall thickening, or biliary dilatation.   Pancreas: Unremarkable. No pancreatic ductal dilatation or surrounding inflammatory changes.   Spleen: Normal in size without focal abnormality.    Adrenals/Urinary Tract: There is a 4 mm calculus in the proximal left ureter. There is mild left-sided hydronephrosis. There is an additional punctate nonobstructing left renal calculus. The adrenal glands, right kidney are within normal limits. Small right-sided bladder diverticulum is present. The bladder is distended.   Stomach/Bowel: Stomach is within normal limits. Appendix appears normal. No evidence of bowel wall thickening, distention, or inflammatory changes.   Vascular/Lymphatic: Aortic atherosclerosis. No enlarged abdominal or pelvic lymph nodes.   Reproductive: Prostate gland is enlarged.   Other: There are small fat containing inguinal and umbilical hernias. No free fluid.   Musculoskeletal: Degenerative changes affect the spine   IMPRESSION: 1. 4 mm calculus in the proximal left ureter with mild obstructive uropathy. 2. Additional punctate nonobstructing left renal calculus. 3. 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 4. Prostatomegaly. 5. Aortic atherosclerosis.  Aortic Atherosclerosis (ICD10-I70.0).     Electronically Signed   By: Ronney Asters M.D.   On: 05/02/2022 15:31  Narrative & Impression  CLINICAL DATA:  Flank pain.   EXAM: ABDOMEN - 1 VIEW   COMPARISON:  May 01, 2013.  May 02, 2022.   FINDINGS: The bowel gas pattern is normal. No definite nephrolithiasis or ureteral calculus is noted. Phlebolith is seen in left-sided pelvis.   IMPRESSION: No definite nephrolithiasis.  No abnormal bowel dilatation.     Electronically Signed   By: Marijo Conception M.D.   On: 05/15/2022 14:57   I have independently reviewed the films.  See HPI.      Assessment & Plan:    1. Kidney stone -Spontaneously passed 4 mm left proximal stone -We will send it off for analysis  2. AKI -Likely secondary to hydronephrosis caused by the nephrolithiasis, we will have him return in 1 month for repeat labs  Return in about 1 month  (around 06/11/2022) for symptom recheck, repeat BMP and UA .  Zara Council, [place] [address] [place], [place] [address] [phone]

## 2022-05-11 ENCOUNTER — Encounter: Payer: Self-pay | Admitting: Urology

## 2022-05-11 ENCOUNTER — Ambulatory Visit
Admission: RE | Admit: 2022-05-11 | Discharge: 2022-05-11 | Disposition: A | Discharge status: Home / Self Care | Payer: Medicare Other | Source: Ambulatory Visit | Attending: Urology | Admitting: Urology

## 2022-05-11 ENCOUNTER — Ambulatory Visit (INDEPENDENT_AMBULATORY_CARE_PROVIDER_SITE_OTHER): Payer: Medicare Other | Admitting: Urology

## 2022-05-11 ENCOUNTER — Other Ambulatory Visit: Payer: Self-pay | Admitting: *Deleted

## 2022-05-11 VITALS — BP 106/64 | HR 69 | Ht 63.0 in | Wt 178.3 lb

## 2022-05-11 DIAGNOSIS — N179 Acute kidney failure, unspecified: Secondary | ICD-10-CM | POA: Diagnosis not present

## 2022-05-11 DIAGNOSIS — N200 Calculus of kidney: Secondary | ICD-10-CM

## 2022-05-11 DIAGNOSIS — Z87442 Personal history of urinary calculi: Secondary | ICD-10-CM | POA: Diagnosis not present

## 2022-05-11 DIAGNOSIS — R109 Unspecified abdominal pain: Secondary | ICD-10-CM

## 2022-05-18 LAB — CALCULI, WITH PHOTOGRAPH (CLINICAL LAB)
Calcium Oxalate Dihydrate: 20 %
Calcium Oxalate Monohydrate: 80 %
Weight Calculi: 72 mg

## 2022-06-13 ENCOUNTER — Ambulatory Visit: Payer: No Typology Code available for payment source | Admitting: Urology

## 2022-06-14 NOTE — Progress Notes (Signed)
06/15/22 9:31 AM   Colton Gill 1940-10-02 [id number]  Referring provider:  Lynnea Ferrier, MD [address] [facility] [place],  [place] [address]  Chief Complaint  Patient presents with   Follow-up   Urological history  BPH with Urinary retention - He was seen in the ED on 07/27/2021 for urinary retention  - Underwent a successful voiding trial with Dr Lonna Cobb on 08/17/2021  2.  Nephrolithiasis -Stone analysis 80% calcium oxalate monohydrate and 20% calcium oxalate dihydrate  -Remote history of ureteral stent placement with ESWL to follow  HPI: Colton Gill is a 82 y.o.male who presents today for ED follow up with his wife, Colton Gill.   At his visit on 05/11/2022, he presented to the ED on 05/01/2022 for left flank pain x 3 days associated with nausea and vomiting.  CT renal stone study (04/2022) - 4 mm calculus in the proximal left ureter with mild obstructive uropathy.  Additional punctate nonobstructing left renal calculus.  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 Prostatomegaly.  Labs in the ED:  WBC count (04/2022) 6.2,  Serum creatinine (04/2022) 2.02.  Meds given in the ED: Norco, Toradol, Zofran and Flomax.  Prior urological history: Stent placement followed by ESWL by Dr. Achilles Dunk.  Current NSAID/anticoagulation:   ASA 81 mg.  Today, he passed the stone early this a.m.  He brings it in for our analysis.  He took 2 pain medicines this morning and feels, nauseous, but other than that he is just tired from being up all night urinating.  Patient denies any modifying or aggravating factors.  Patient denies any gross hematuria, dysuria or suprapubic/flank pain.  Patient denies any fevers, chills, nausea or vomiting.    He has not had any further episodes of renal colic or passage of other fragments.  Patient denies any modifying or aggravating factors.  Patient denies any gross hematuria, dysuria or suprapubic/flank pain.  Patient denies any  fevers, chills, nausea or vomiting.    UA urinalysis is yellow clear, 3+ glucose, specific gravity 1.015, pH 5.5, WBC 0-5, RBC 0-2, 0-10 epithelial cells and few bacteria.    PMH: Past Medical History:  Diagnosis Date   Chronic kidney disease    Coronary artery disease    Diabetes mellitus without complication (HCC)    Erectile dysfunction    Hyperlipidemia    Vertigo     Surgical History: Past Surgical History:  Procedure Laterality Date   CATARACT EXTRACTION W/PHACO Right 03/08/2016   Procedure: CATARACT EXTRACTION PHACO AND INTRAOCULAR LENS PLACEMENT (IOC);  Surgeon: Sallee Lange, MD;  Location: [facility] ORS;  Service: Ophthalmology;  Laterality: Right;  [place] 02.0 AP% 11.4 CDE 0.57 Fluid pack lot # [id number]   COLONOSCOPY     COLONOSCOPY WITH PROPOFOL N/A 11/10/2014   Procedure: COLONOSCOPY WITH PROPOFOL;  Surgeon: Christena Deem, MD;  Location: [facility] ENDOSCOPY;  Service: Endoscopy;  Laterality: N/A;   CORONARY ARTERY BYPASS GRAFT     EYE SURGERY      Home Medications:  Allergies as of 06/15/2022   No Known Allergies      Medication List        Accurate as of June 15, 2022  9:31 AM. If you have any questions, ask your nurse or doctor.          aspirin 81 MG tablet Take 81 mg by mouth daily.   cyanocobalamin 1000 MCG tablet Commonly known as: VITAMIN B12 Take 1,000 mcg by mouth daily.  empagliflozin 25 MG Tabs tablet Commonly known as: JARDIANCE Take 1 tablet by mouth daily.   Fenofibric Acid 135 MG Cpdr Take 135 mg by mouth daily.   FISH OIL PO Take 1 capsule by mouth daily.   glimepiride 4 MG tablet Commonly known as: AMARYL Take 4 mg by mouth 2 (two) times daily.   HYDROcodone-acetaminophen 5-325 MG tablet Commonly known as: Norco Take 1 tablet by mouth every 4 (four) hours as needed for moderate pain or severe pain.   ketorolac 10 MG tablet Commonly known as: TORADOL Take 1 tablet (10 mg total) by mouth every 6 (six) hours as needed.    LEVEMIR FLEXPEN [place] Inject 34 Units into the skin at bedtime.   multivitamin with minerals Tabs tablet Take 1 tablet by mouth daily.   ondansetron 8 MG disintegrating tablet Commonly known as: ZOFRAN-ODT Take 1 tablet (8 mg total) by mouth every 8 (eight) hours as needed for nausea or vomiting.   pioglitazone 30 MG tablet Commonly known as: ACTOS Take 30 mg by mouth daily.   ramipril 2.5 MG capsule Commonly known as: ALTACE Take 2.5 mg by mouth daily.   simvastatin 40 MG tablet Commonly known as: ZOCOR Take 40 mg by mouth daily at 6 PM.   vitamin C 1000 MG tablet Take 1,000 mg by mouth daily.        Allergies:  No Known Allergies  Family History: No family history on file.  Social History:  reports that he has never smoked. He has never used smokeless tobacco. He reports current alcohol use. He reports that he does not use drugs.   Physical Exam: BP 123/62   Pulse 72   Ht  (1.6 m)   Wt 178 lb (80.7 kg)   BMI 31.53 kg/m   Constitutional:  Well nourished. Alert and oriented, No acute distress. HEENT: [place] AT, moist mucus membranes.  Trachea midline Cardiovascular: No clubbing, cyanosis, or edema. Respiratory: Normal respiratory effort, no increased work of breathing. Neurologic: Grossly intact, no focal deficits, moving all 4 extremities. Psychiatric: Normal mood and affect.    Laboratory Data: N/A  Pertinent imaging:  N/A   Assessment & Plan:    1. Kidney stone -calcium oxalate stones -given the ABC's of stone prevent -Explained over the next 10 years he has a 50% chance of forming another stone -Discussed general stone prevention and offered 24-hour metabolic workup, but he declined -Recommended to return in 1 year for follow-up KUB, but he would like to make that in 2 years  2. AKI -BMP pending  Return in about 2 years (around 06/14/2024) for KUB .  Colton Cowboy, PA-C   [facility] [address] [place], [place] [address] [phone]

## 2022-06-15 ENCOUNTER — Ambulatory Visit (INDEPENDENT_AMBULATORY_CARE_PROVIDER_SITE_OTHER): Payer: No Typology Code available for payment source | Admitting: Urology

## 2022-06-15 ENCOUNTER — Encounter: Payer: Self-pay | Admitting: Urology

## 2022-06-15 ENCOUNTER — Other Ambulatory Visit: Payer: Self-pay

## 2022-06-15 VITALS — BP 123/62 | HR 72 | Ht 63.0 in | Wt 178.0 lb

## 2022-06-15 DIAGNOSIS — R109 Unspecified abdominal pain: Secondary | ICD-10-CM

## 2022-06-15 DIAGNOSIS — Z87442 Personal history of urinary calculi: Secondary | ICD-10-CM | POA: Diagnosis not present

## 2022-06-15 DIAGNOSIS — N179 Acute kidney failure, unspecified: Secondary | ICD-10-CM | POA: Diagnosis not present

## 2022-06-15 LAB — MICROSCOPIC EXAMINATION

## 2022-06-15 LAB — URINALYSIS, COMPLETE
Bilirubin, UA: NEGATIVE
Ketones, UA: NEGATIVE
Leukocytes,UA: NEGATIVE
Nitrite, UA: NEGATIVE
Protein,UA: NEGATIVE
RBC, UA: NEGATIVE
Specific Gravity, UA: 1.015 (ref 1.005–1.030)
Urobilinogen, Ur: 0.2 mg/dL (ref 0.2–1.0)
pH, UA: 5.5 (ref 5.0–7.5)

## 2022-06-16 ENCOUNTER — Other Ambulatory Visit: Payer: Self-pay | Admitting: Urology

## 2022-06-16 ENCOUNTER — Telehealth: Payer: Self-pay | Admitting: Family Medicine

## 2022-06-16 DIAGNOSIS — N201 Calculus of ureter: Secondary | ICD-10-CM

## 2022-06-16 LAB — BASIC METABOLIC PANEL
BUN/Creatinine Ratio: 16 (ref 10–24)
BUN: 34 mg/dL — ABNORMAL HIGH (ref 8–27)
CO2: 20 mmol/L (ref 20–29)
Calcium: 10 mg/dL (ref 8.6–10.2)
Chloride: 103 mmol/L (ref 96–106)
Creatinine, Ser: 2.09 mg/dL — ABNORMAL HIGH (ref 0.76–1.27)
Glucose: 214 mg/dL — ABNORMAL HIGH (ref 70–99)
Potassium: 4.7 mmol/L (ref 3.5–5.2)
Sodium: 139 mmol/L (ref 134–144)
eGFR: 32 mL/min/{1.73_m2} — ABNORMAL LOW (ref >59)

## 2022-06-16 NOTE — Telephone Encounter (Signed)
-----   Message from Harle Battiest, PA-C sent at 06/16/2022  8:04 AM EDT ----- Please let Mr. Geers know that his renal function is still below his baseline.  We need to get a RUS to rule out hydronephrosis.

## 2022-06-16 NOTE — Telephone Encounter (Signed)
Patient's wife notified and he will do RUS.

## 2022-06-28 ENCOUNTER — Ambulatory Visit
Admission: RE | Admit: 2022-06-28 | Discharge: 2022-06-28 | Disposition: A | Discharge status: Home / Self Care | Payer: Medicare Other | Source: Ambulatory Visit | Attending: Urology | Admitting: Urology

## 2022-06-28 DIAGNOSIS — N201 Calculus of ureter: Secondary | ICD-10-CM

## 2022-06-30 ENCOUNTER — Telehealth: Payer: Self-pay | Admitting: Family Medicine

## 2022-06-30 NOTE — Telephone Encounter (Signed)
-----   Message from Harle Battiest, PA-C sent at 06/30/2022 12:28 PM EDT ----- Please let Colton Gill know that the renal ultrasound did not show any reason for his slight decrease in his renal function.  I would recommend seeing his PCP to rule out other issues that may be causing the decline in his kidney function.  They did however have some incidental findings that they recommend repeating the ultrasound in 6 months and we will get that scheduled.

## 2022-06-30 NOTE — Telephone Encounter (Signed)
LM for patient to return call.

## 2022-07-03 ENCOUNTER — Other Ambulatory Visit: Payer: Self-pay | Admitting: Family Medicine

## 2022-07-03 DIAGNOSIS — N2889 Other specified disorders of kidney and ureter: Secondary | ICD-10-CM

## 2022-07-03 NOTE — Telephone Encounter (Signed)
Patient's wife notified and voiced understanding.

## 2022-07-03 NOTE — Addendum Note (Signed)
Addended by: Honor Loh on: 07/03/2022 10:02 AM   Modules accepted: Orders

## 2022-12-26 ENCOUNTER — Ambulatory Visit
Admission: RE | Admit: 2022-12-26 | Discharge: 2022-12-26 | Disposition: A | Discharge status: Home / Self Care | Payer: Medicare Other | Source: Ambulatory Visit | Attending: Urology | Admitting: Urology

## 2022-12-26 DIAGNOSIS — N2889 Other specified disorders of kidney and ureter: Secondary | ICD-10-CM | POA: Insufficient documentation

## 2023-05-29 ENCOUNTER — Other Ambulatory Visit: Payer: Self-pay | Admitting: Urology

## 2023-05-29 DIAGNOSIS — N200 Calculus of kidney: Secondary | ICD-10-CM

## 2023-05-29 NOTE — Progress Notes (Unsigned)
 05/30/23 10:19 PM   Colton Gill September 22, 1940 [id number]  Referring provider:  Lynnea Ferrier, MD [address] [facility] [place],  [place] [address]  Urological history  BPH with retention - He was seen in the ED on 07/27/2021 for urinary retention  - Underwent a successful voiding trial with Dr Lonna Cobb on 08/17/2021  2.  Nephrolithiasis -Stone analysis 80% calcium oxalate monohydrate and 20% calcium oxalate dihydrate  -Remote history of ureteral stent placement with ESWL  -RUS (12/2022) no hydro, no stones   Chief Complaint  Patient presents with   Follow-up    HPI: Colton Gill is a 83 y.o.male who presents today for yearly follow up.    Previous records reviewed.     KUB no stones seen   He is feeling well.  Patient denies any modifying or aggravating factors.  Patient denies any recent UTI's, gross hematuria, dysuria or suprapubic/flank pain.  Patient denies any fevers, chills, nausea or vomiting.    He is having issues with ED, but his wife is not as interested in sexually activity.  He also realizes that he is getting older and has to have realistic.       PMH: Past Medical History:  Diagnosis Date   Chronic kidney disease    Coronary artery disease    Diabetes mellitus without complication (HCC)    Erectile dysfunction    Hyperlipidemia    Vertigo     Surgical History: Past Surgical History:  Procedure Laterality Date   CATARACT EXTRACTION W/PHACO Right 03/08/2016   Procedure: CATARACT EXTRACTION PHACO AND INTRAOCULAR LENS PLACEMENT (IOC);  Surgeon: Sallee Lange, MD;  Location: [facility] ORS;  Service: Ophthalmology;  Laterality: Right;  [place] 02.0 AP% 11.4 CDE 0.57 Fluid pack lot # [id number]   COLONOSCOPY     COLONOSCOPY WITH PROPOFOL N/A 11/10/2014   Procedure: COLONOSCOPY WITH PROPOFOL;  Surgeon: Christena Deem, MD;  Location: [facility] ENDOSCOPY;  Service: Endoscopy;  Laterality: N/A;   CORONARY ARTERY BYPASS GRAFT     EYE  SURGERY      Home Medications:  Allergies as of 05/30/2023   No Known Allergies      Medication List        Accurate as of May 30, 2023 10:19 PM. If you have any questions, ask your nurse or doctor.          STOP taking these medications    HYDROcodone-acetaminophen 5-325 MG tablet Commonly known as: Norco Stopped by: Carollee Herter Mattheu Brodersen   ketorolac 10 MG tablet Commonly known as: TORADOL Stopped by: Naidelyn Parrella   ondansetron 8 MG disintegrating tablet Commonly known as: ZOFRAN-ODT Stopped by: Zayvian Mcmurtry       TAKE these medications    aspirin 81 MG tablet Take 81 mg by mouth daily.   cyanocobalamin 1000 MCG tablet Commonly known as: VITAMIN B12 Take 1,000 mcg by mouth daily.   empagliflozin 25 MG Tabs tablet Commonly known as: JARDIANCE Take 1 tablet by mouth daily.   Fenofibric Acid 135 MG Cpdr Take 135 mg by mouth daily.   FISH OIL PO Take 1 capsule by mouth daily.   glimepiride 4 MG tablet Commonly known as: AMARYL Take 4 mg by mouth 2 (two) times daily.   LEVEMIR FLEXPEN [place] Inject 15 Units into the skin at bedtime.   multivitamin with minerals Tabs tablet Take 1 tablet by mouth daily.   pioglitazone 30 MG tablet Commonly known as: ACTOS Take 30 mg by mouth  daily.   ramipril 2.5 MG capsule Commonly known as: ALTACE Take 2.5 mg by mouth daily.   simvastatin 40 MG tablet Commonly known as: ZOCOR Take 40 mg by mouth daily at 6 PM.   vitamin C 1000 MG tablet Take 1,000 mg by mouth daily.        Allergies:  No Known Allergies  Family History: No family history on file.  Social History:  reports that he has never smoked. He has never used smokeless tobacco. He reports current alcohol use. He reports that he does not use drugs.   Physical Exam: BP 133/63   Pulse 80   Ht 5\' 4"  (1.626 m)   Wt 180 lb (81.6 kg)   BMI 30.90 kg/m   Constitutional:  Well nourished. Alert and oriented, No acute distress. HEENT: [place] AT, moist  mucus membranes.  Trachea midline Cardiovascular: No clubbing, cyanosis, or edema. Respiratory: Normal respiratory effort, no increased work of breathing. Neurologic: Grossly intact, no focal deficits, moving all 4 extremities. Psychiatric: Normal mood and affect.   Laboratory Data: Basic Metabolic Panel (BMP) Order: [id number] Component Ref Range & Units 1 mo ago  Glucose 70 - 110 mg/dL 272  Sodium 536 - 644 mmol/L 141  Potassium 3.6 - 5.1 mmol/L 4.6  Chloride 97 - 109 mmol/L 107  Carbon Dioxide (CO2) 22.0 - 32.0 mmol/L 25.9  Calcium 8.7 - 10.3 mg/dL 10  Urea Nitrogen (BUN) 7 - 25 mg/dL 37 High   Creatinine 0.7 - 1.3 mg/dL 1.9 High   Glomerular Filtration Rate (eGFR) >60 mL/min/1.73sq m 35 Low   Comment: CKD-EPI (2021) does not include patient's race in the calculation of eGFR.  Monitoring changes of plasma creatinine and eGFR over time is useful for monitoring kidney function.  Interpretive Ranges for eGFR (CKD-EPI 2021):  eGFR:       >60 mL/min/1.73 sq. m - Normal eGFR:       30-59 mL/min/1.73 sq. m - Moderately Decreased eGFR:       15-29 mL/min/1.73 sq. m  - Severely Decreased eGFR:       < 15 mL/min/1.73 sq. m  - Kidney Failure   Note: These eGFR calculations do not apply in acute situations when eGFR is changing rapidly or patients on dialysis.  BUN/Crea Ratio 6.0 - 20.0 19.5  Anion Gap w/K 6.0 - 16.0 12.7  Resulting Agency [facility] - [facility]   Specimen Collected: 04/18/23 08:01   Performed by: [facility] Last Resulted: 04/18/23 10:33  Received From: [facility]  Result Received: 05/07/23 22:03    Lipid Panel w/calc LDL Order: [id number] Component Ref Range & Units 1 mo ago  Cholesterol, Total 100 - 200 mg/dL 638  Triglyceride 35 - 199 mg/dL 756  HDL (High Density Lipoprotein) Cholesterol 29.0 - 71.0 mg/dL 43.3  LDL Calculated 0 - 130 mg/dL 69  VLDL Cholesterol mg/dL 38  Cholesterol/HDL Ratio 3.8   Resulting Agency [facility]   Specimen Collected: 04/05/23 10:36   Performed by: [facility] Last Resulted: 04/05/23 14:32  Received From: [facility]  Result Received: 05/07/23 22:03   Thyroid Stimulating-Hormone (TSH) Order: [id number] Component Ref Range & Units 1 mo ago  Thyroid Stimulating Hormone (TSH) 0.450-5.330 uIU/ml uIU/mL 1.406  Resulting Agency [facility] - LAB   Specimen Collected: 04/05/23 10:36   Performed by: [facility] Last Resulted: 04/05/23 13:57  Received From: [facility]  Result Received: 05/07/23 22:03     Hemoglobin A1C Order: [id number] Component Ref Range & Units 2 mo ago  Hemoglobin A1C 4.2 - 5.6 % 7 High   Average Blood Glucose (Calc) mg/dL 425  Resulting Agency [facility]  Narrative Performed by [facility] - LAB Normal Range:    4.2 - 5.6% Increased Risk:  5.7 - 6.4% Diabetes:        >= 6.5% Glycemic Control for adults with diabetes:  <7%    Specimen Collected: 03/13/23 07:23   Performed by: [facility] Last Resulted: 03/13/23 09:58  Received From: [facility]  Result Received: 05/07/23 22:03  I have reviewed the labs.  See HPI.      Pertinent imaging:  KUB no stones seen, radiologist interpretation still pending I have independently reviewed the films.  See HPI.     Assessment & Plan:    1. Kidney stone -calcium oxalate stones -no stones seen on today's KUB - He has had no symptoms of renal colic - He will follow-up as needed  2. ED -No desire to pursue any other treatment options at this time  Return if symptoms worsen or fail to improve.  [name], PA-C   [facility] [address], [address] [place], [place] [address] [phone]

## 2023-05-30 ENCOUNTER — Ambulatory Visit
Admission: RE | Admit: 2023-05-30 | Discharge: 2023-05-30 | Disposition: A | Discharge status: Home / Self Care | Source: Ambulatory Visit | Attending: Urology | Admitting: Urology

## 2023-05-30 ENCOUNTER — Ambulatory Visit (INDEPENDENT_AMBULATORY_CARE_PROVIDER_SITE_OTHER): Payer: Medicare Other | Admitting: Urology

## 2023-05-30 ENCOUNTER — Encounter: Payer: Self-pay | Admitting: Urology

## 2023-05-30 VITALS — BP 133/63 | HR 80 | Ht 64.0 in | Wt 180.0 lb

## 2023-05-30 DIAGNOSIS — N529 Male erectile dysfunction, unspecified: Secondary | ICD-10-CM | POA: Diagnosis not present

## 2023-05-30 DIAGNOSIS — N200 Calculus of kidney: Secondary | ICD-10-CM

## 2023-10-11 ENCOUNTER — Other Ambulatory Visit: Payer: Self-pay | Admitting: Nephrology

## 2023-10-11 DIAGNOSIS — E1329 Other specified diabetes mellitus with other diabetic kidney complication: Secondary | ICD-10-CM

## 2023-10-11 DIAGNOSIS — R8290 Unspecified abnormal findings in urine: Secondary | ICD-10-CM

## 2023-10-11 DIAGNOSIS — N1832 Chronic kidney disease, stage 3b: Secondary | ICD-10-CM

## 2023-10-19 ENCOUNTER — Ambulatory Visit

## 2023-10-24 ENCOUNTER — Ambulatory Visit
Admission: RE | Admit: 2023-10-24 | Discharge: 2023-10-24 | Disposition: A | Discharge status: Home / Self Care | Source: Ambulatory Visit | Attending: Nephrology | Admitting: Nephrology

## 2023-10-24 DIAGNOSIS — E1329 Other specified diabetes mellitus with other diabetic kidney complication: Secondary | ICD-10-CM | POA: Insufficient documentation

## 2023-10-24 DIAGNOSIS — R8290 Unspecified abnormal findings in urine: Secondary | ICD-10-CM | POA: Insufficient documentation

## 2023-10-24 DIAGNOSIS — N1832 Chronic kidney disease, stage 3b: Secondary | ICD-10-CM | POA: Diagnosis present
